# Patient Record
Sex: Female | Born: 1960 | Race: White | Hispanic: No | Marital: Married | Smoking: Current every day smoker
Health system: Southern US, Community
[De-identification: ages and names within clinical notes are randomized; demographics above are authoritative.]

## PROBLEM LIST (undated history)

## (undated) DIAGNOSIS — I10 Essential (primary) hypertension: Secondary | ICD-10-CM

## (undated) DIAGNOSIS — F419 Anxiety disorder, unspecified: Secondary | ICD-10-CM

## (undated) DIAGNOSIS — E785 Hyperlipidemia, unspecified: Secondary | ICD-10-CM

## (undated) DIAGNOSIS — M1990 Unspecified osteoarthritis, unspecified site: Secondary | ICD-10-CM

## (undated) DIAGNOSIS — L03116 Cellulitis of left lower limb: Secondary | ICD-10-CM

## (undated) HISTORY — DX: Anxiety disorder, unspecified: F41.9

## (undated) HISTORY — DX: Essential (primary) hypertension: I10

## (undated) HISTORY — DX: Unspecified osteoarthritis, unspecified site: M19.90

---

## 1984-01-18 HISTORY — PX: TUBAL LIGATION: SHX77

## 2008-01-18 HISTORY — PX: LAPAROSCOPIC CHOLECYSTECTOMY: SUR755

## 2014-10-28 ENCOUNTER — Other Ambulatory Visit: Payer: Self-pay | Admitting: Pulmonary Disease

## 2014-10-28 DIAGNOSIS — Z1231 Encounter for screening mammogram for malignant neoplasm of breast: Secondary | ICD-10-CM

## 2014-11-05 ENCOUNTER — Ambulatory Visit: Payer: Self-pay

## 2017-06-21 ENCOUNTER — Inpatient Hospital Stay (HOSPITAL_COMMUNITY)
Admission: EM | Admit: 2017-06-21 | Discharge: 2017-06-23 | DRG: 603 | Disposition: A | Discharge status: Home / Self Care | Payer: BLUE CROSS/BLUE SHIELD | Attending: Family Medicine | Admitting: Family Medicine

## 2017-06-21 ENCOUNTER — Inpatient Hospital Stay (HOSPITAL_COMMUNITY): Payer: BLUE CROSS/BLUE SHIELD

## 2017-06-21 ENCOUNTER — Other Ambulatory Visit: Payer: Self-pay

## 2017-06-21 ENCOUNTER — Encounter (HOSPITAL_COMMUNITY): Payer: Self-pay | Admitting: Emergency Medicine

## 2017-06-21 DIAGNOSIS — B9562 Methicillin resistant Staphylococcus aureus infection as the cause of diseases classified elsewhere: Secondary | ICD-10-CM | POA: Diagnosis present

## 2017-06-21 DIAGNOSIS — I10 Essential (primary) hypertension: Secondary | ICD-10-CM | POA: Diagnosis not present

## 2017-06-21 DIAGNOSIS — R402413 Glasgow coma scale score 13-15, at hospital admission: Secondary | ICD-10-CM | POA: Diagnosis present

## 2017-06-21 DIAGNOSIS — Z833 Family history of diabetes mellitus: Secondary | ICD-10-CM

## 2017-06-21 DIAGNOSIS — R739 Hyperglycemia, unspecified: Secondary | ICD-10-CM | POA: Diagnosis present

## 2017-06-21 DIAGNOSIS — E876 Hypokalemia: Secondary | ICD-10-CM | POA: Diagnosis present

## 2017-06-21 DIAGNOSIS — E785 Hyperlipidemia, unspecified: Secondary | ICD-10-CM | POA: Diagnosis not present

## 2017-06-21 DIAGNOSIS — L0390 Cellulitis, unspecified: Secondary | ICD-10-CM

## 2017-06-21 DIAGNOSIS — L03116 Cellulitis of left lower limb: Principal | ICD-10-CM | POA: Diagnosis present

## 2017-06-21 DIAGNOSIS — E782 Mixed hyperlipidemia: Secondary | ICD-10-CM

## 2017-06-21 DIAGNOSIS — M79605 Pain in left leg: Secondary | ICD-10-CM | POA: Diagnosis not present

## 2017-06-21 HISTORY — DX: Cellulitis of left lower limb: L03.116

## 2017-06-21 HISTORY — DX: Hyperlipidemia, unspecified: E78.5

## 2017-06-21 LAB — HEMOGLOBIN A1C
HEMOGLOBIN A1C: 5.4 % (ref 4.8–5.6)
MEAN PLASMA GLUCOSE: 108.28 mg/dL

## 2017-06-21 LAB — I-STAT CG4 LACTIC ACID, ED: Lactic Acid, Venous: 1.27 mmol/L (ref 0.5–1.9)

## 2017-06-21 LAB — COMPREHENSIVE METABOLIC PANEL
ALK PHOS: 89 U/L (ref 38–126)
ALT: 30 U/L (ref 14–54)
ANION GAP: 11 (ref 5–15)
AST: 24 U/L (ref 15–41)
Albumin: 3.4 g/dL — ABNORMAL LOW (ref 3.5–5.0)
BUN: 11 mg/dL (ref 6–20)
CALCIUM: 9 mg/dL (ref 8.9–10.3)
CO2: 26 mmol/L (ref 22–32)
Chloride: 103 mmol/L (ref 101–111)
Creatinine, Ser: 0.8 mg/dL (ref 0.44–1.00)
Glucose, Bld: 126 mg/dL — ABNORMAL HIGH (ref 65–99)
Potassium: 3.1 mmol/L — ABNORMAL LOW (ref 3.5–5.1)
Sodium: 140 mmol/L (ref 135–145)
TOTAL PROTEIN: 7.9 g/dL (ref 6.5–8.1)
Total Bilirubin: 0.5 mg/dL (ref 0.3–1.2)

## 2017-06-21 LAB — CBC WITH DIFFERENTIAL/PLATELET
ABS IMMATURE GRANULOCYTES: 0 10*3/uL (ref 0.0–0.1)
BASOS ABS: 0 10*3/uL (ref 0.0–0.1)
BASOS PCT: 0 %
Eosinophils Absolute: 0.3 10*3/uL (ref 0.0–0.7)
Eosinophils Relative: 4 %
HCT: 41.5 % (ref 36.0–46.0)
Hemoglobin: 13.8 g/dL (ref 12.0–15.0)
IMMATURE GRANULOCYTES: 0 %
Lymphocytes Relative: 25 %
Lymphs Abs: 1.8 10*3/uL (ref 0.7–4.0)
MCH: 29.1 pg (ref 26.0–34.0)
MCHC: 33.3 g/dL (ref 30.0–36.0)
MCV: 87.4 fL (ref 78.0–100.0)
MONO ABS: 0.6 10*3/uL (ref 0.1–1.0)
Monocytes Relative: 9 %
NEUTROS ABS: 4.2 10*3/uL (ref 1.7–7.7)
NEUTROS PCT: 62 %
PLATELETS: 275 10*3/uL (ref 150–400)
RBC: 4.75 MIL/uL (ref 3.87–5.11)
RDW: 13.4 % (ref 11.5–15.5)
WBC: 6.9 10*3/uL (ref 4.0–10.5)

## 2017-06-21 LAB — LIPID PANEL
CHOL/HDL RATIO: 5.6 ratio
Cholesterol: 211 mg/dL — ABNORMAL HIGH (ref 0–200)
HDL: 38 mg/dL — AB (ref >40)
LDL CALC: 153 mg/dL — AB (ref 0–99)
Triglycerides: 99 mg/dL (ref <150)
VLDL: 20 mg/dL (ref 0–40)

## 2017-06-21 MED ORDER — ACETAMINOPHEN 325 MG PO TABS
650.0000 mg | ORAL_TABLET | Freq: Four times a day (QID) | ORAL | Status: DC | PRN
  Administered 2017-06-21: 650 mg via ORAL
  Filled 2017-06-21: qty 2

## 2017-06-21 MED ORDER — VANCOMYCIN HCL IN DEXTROSE 1-5 GM/200ML-% IV SOLN
1000.0000 mg | Freq: Once | INTRAVENOUS | Status: AC
  Administered 2017-06-21: 1000 mg via INTRAVENOUS
  Filled 2017-06-21: qty 200

## 2017-06-21 MED ORDER — IBUPROFEN 400 MG PO TABS
400.0000 mg | ORAL_TABLET | Freq: Four times a day (QID) | ORAL | Status: DC | PRN
  Administered 2017-06-22: 400 mg via ORAL
  Filled 2017-06-21: qty 1

## 2017-06-21 MED ORDER — CEFAZOLIN SODIUM-DEXTROSE 2-4 GM/100ML-% IV SOLN
2.0000 g | Freq: Three times a day (TID) | INTRAVENOUS | Status: DC
  Filled 2017-06-21: qty 100

## 2017-06-21 MED ORDER — CEFAZOLIN SODIUM-DEXTROSE 1-4 GM/50ML-% IV SOLN
1.0000 g | Freq: Once | INTRAVENOUS | Status: AC
  Administered 2017-06-21: 1 g via INTRAVENOUS
  Filled 2017-06-21: qty 50

## 2017-06-21 MED ORDER — VANCOMYCIN HCL IN DEXTROSE 1-5 GM/200ML-% IV SOLN
1000.0000 mg | INTRAVENOUS | Status: AC
  Administered 2017-06-21: 1000 mg via INTRAVENOUS
  Filled 2017-06-21: qty 200

## 2017-06-21 MED ORDER — PIPERACILLIN-TAZOBACTAM 3.375 G IVPB
3.3750 g | Freq: Three times a day (TID) | INTRAVENOUS | Status: DC
  Administered 2017-06-21 – 2017-06-22 (×3): 3.375 g via INTRAVENOUS
  Filled 2017-06-21 (×3): qty 50

## 2017-06-21 MED ORDER — ENOXAPARIN SODIUM 40 MG/0.4ML ~~LOC~~ SOLN
40.0000 mg | SUBCUTANEOUS | Status: DC
  Administered 2017-06-21 – 2017-06-22 (×2): 40 mg via SUBCUTANEOUS
  Filled 2017-06-21 (×2): qty 0.4

## 2017-06-21 MED ORDER — VANCOMYCIN HCL IN DEXTROSE 750-5 MG/150ML-% IV SOLN
750.0000 mg | Freq: Two times a day (BID) | INTRAVENOUS | Status: DC
  Administered 2017-06-22 – 2017-06-23 (×3): 750 mg via INTRAVENOUS
  Filled 2017-06-21 (×3): qty 150

## 2017-06-21 NOTE — Progress Notes (Signed)
MD call to verify if nasal swap is needed for MRSA no orders for MRSA swap wound is being treated as MRSA infection. Will continue to monitor.Arthor Captain LPN

## 2017-06-21 NOTE — H&P (Addendum)
[facility] Admission History and Physical Service Pager: [phone]  Patient name: [name] [name] record number: [id number] Date of birth: 1960/07/10 Age: 57 y.o. Gender: female  Primary Care Provider: Patient, No Pcp Per Consultants: none Code Status: full  Chief Complaint: cellulitis  Assessment and Plan: [name] [name] is a 57 y.o. female presenting with rash. PMH is significant for remote tobacco use, hyperglycemia, and HLD.  L Leg Cellulitis, likely MRSA: Patient has highly progressing cellulitis that has failed Keflex treatment.  While her vitals, WBC, and lactic acid are reassuring and her wound is not draining purulent material, the severity of her rash and unresponsiveness to Keflex indicates that she needs broad spectrum IV antibiotics due to possible MRSA infection (or anaerobes/pseudomonas/other gram negatives not covered by Keflex).  Lack of eschar, crepitus, and pallor and presence of pulses and sensation distal to the affect area make necrotizing fasciitis less likely, but it is still on the differential.  Patient is not immunosuppressed as far as we know, although she does have a family history of T2DM and hyperglycemia on her BMP.  Will start IV antibiotics and obtain xray of leg to further investigate possibility of necrotizing fasciitis. - admit to med-surg, attending Dr. Andria Frames - Vancomycin and Zosyn, dosed by pharmacy. S/p ancef in ED - wound demarcated, will monitor borders - pain control with tylenol and ibuprofen - x-ray of L tib/fib - will consult surgery if patient develops signs concerning for compartment syndrome - contact precautions - am CBC, BMP - wound care consult - blood cultures x2 obtained  Hyperglycemia: Patient with strong family history of T2DM, obesity, and blood sugar of 126 on BMP.  T2DM would increase patient's susceptibility to pseudomonas and other bacteria.  Last A1c was two years ago and normal per patient. -  obtain Hgb A1c  HLD: Patient reports that she had high cholesterol when it was checked two years ago but does not remember the actual values of her lipid panel. - obtain lipid panel  FEN/GI: regular diet Prophylaxis: lovenox  Disposition: admit to med-surg, attending Dr. Andria Frames  History of Present Illness:  [name] [name] is a 57 y.o. female presenting with cellulitis.  She reports that a day or so before [facility], she came into contact with poison ivy.  On Friday, 5/31, she felt like she had a fever, then she noticed a rash on her anterior left shin the following morning.  She went to Urgent Care that day, who prescribed her Keflex for cellulitis.  She took this medication as prescribed, but on Sunday, she noticed that she began to develop small blisters.  These blisters increased in size, and her rash spread up her shin and to the posterior area of her L leg on Monday and Tuesday.  She took about 6 tabs of 200 mg ibuprofen each day to help ease the pain of the rash.  There has been no itchiness from the rash.  She continued to have subjective fevers, with chills and a loss of appetite over the course of this rash, but she did not take her temperature.  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  She does not remember any injury or breaks in the skin preceding the rash, but she says it's possible that she nicked the area.  She would like to go home as soon as possible because she has a husband at home with MS who she  needs to care for.  Review Of Systems: Per HPI with the following additions:   Review of Systems  Constitutional: Positive for chills, fever and weight loss. Negative for diaphoresis.  Respiratory: Negative for cough and shortness of breath.   Cardiovascular: Negative for chest pain.  Gastrointestinal: Positive for nausea. Negative for constipation, diarrhea and vomiting.  Genitourinary: Negative for dysuria,  frequency and urgency.  Musculoskeletal: Negative for myalgias.  Skin: Positive for rash. Negative for itching.  Neurological: Negative for dizziness, sensory change and headaches.  Endo/Heme/Allergies: Does not bruise/bleed easily.    Patient Active Problem List   Diagnosis Date Noted  . Cellulitis 06/21/2017  . Hyperlipidemia 06/21/2017  . Hyperglycemia 06/21/2017    Past Medical History: Past Medical History:  Diagnosis Date  . Hyperlipidemia     Past Surgical History: Past Surgical History:  Procedure Laterality Date  . CHOLECYSTECTOMY    . TUBAL LIGATION      Social History: Social History   Tobacco Use  . Smoking status: Former Smoker    Packs/day: 1.00    Years: 40.00    Pack years: 40.00    Types: Cigarettes    Last attempt to quit: 2004    Years since quitting: 15.4  . Smokeless tobacco: Never Used  Substance Use Topics  . Alcohol use: Not Currently  . Drug use: Not Currently   Additional social history: lives with husband, son, and grandson. Works as RT at [employer] Please also refer to relevant sections of EMR.  Family History: Family History  Problem Relation Age of Onset  . Diabetes Mother     Allergies and Medications: No Known Allergies No current facility-administered medications on file prior to encounter.    No current outpatient medications on file prior to encounter.    Objective: BP 134/72   Pulse 82   Temp 98.1 F (36.7 C) (Oral)   Resp 16   Ht 5\' 10"  (1.778 m)   Wt 240 lb (108.9 kg)   SpO2 96%   BMI 34.44 kg/m  Exam: Physical Exam  Constitutional: She is oriented to person, place, and time. She appears well-developed and well-nourished. No distress.  HENT:  Head: Normocephalic and atraumatic.  Eyes: Pupils are equal, round, and reactive to light. Conjunctivae and EOM are normal.  Neck: Normal range of motion. Neck supple.  Cardiovascular: Normal rate, regular rhythm, normal heart sounds and intact distal pulses.   Pulmonary/Chest: Effort normal and breath sounds normal. No respiratory distress.  Abdominal: Soft. Bowel sounds are normal. There is no tenderness.  Musculoskeletal: Normal range of motion.  Neurological: She is alert and oriented to person, place, and time.  Skin: Rash noted.  Well demarcated, erythematous rash on L leg.  Circumferential but without eschar.  Large blisters draining serous fluid on anterior and medial sides of shin.  1+ DP pulse on L, no crepitus felt.  L foot feels warm.  Refer to media tab or ED note for images.  Psychiatric: She has a normal mood and affect. Her behavior is normal.     Labs and Imaging: CBC BMET  Recent Labs  Lab 06/21/17 0929  WBC 6.9  HGB 13.8  HCT 41.5  PLT 275   Recent Labs  Lab 06/21/17 0929  NA 140  K 3.1*  CL 103  CO2 26  BUN 11  CREATININE 0.80  GLUCOSE 126*  CALCIUM 9.0     Lactic Acid, Venous    Component Value Date/Time   LATICACIDVEN 1.27 06/21/2017  8115     Kathrene Alu, MD 06/21/2017, 3:37 PM PGY-1, [facility] Intern pager: [phone], text pages welcome  FPTS Upper-Level Resident Addendum  I have independently interviewed and examined the patient. I have discussed the above with the original author and agree with their documentation. My edits for correction/addition/clarification are in blue. Please see also any attending notes.   Bufford Lope, DO PGY-2, [facility] 06/21/2017 4:22 PM

## 2017-06-21 NOTE — ED Provider Notes (Signed)
[facility] EMERGENCY DEPARTMENT Provider Note   CSN: [id number] Arrival date & time: 06/21/17  3086     History   Chief Complaint No chief complaint on file.   HPI [name] [name] is a 57 y.o. female who is previously healthy who presents with a 5-day history of rash to her left lower leg.  She reports the day before she developed a fever.  She has had increasing pain, redness, and now blistering with bruising.  She continues to have a fever.  She believes she had a fever last night which improved with ibuprofen.  She went to urgent care 5 days ago at first side of the rash who gave her Keflex.  Her symptoms have worsened and she went to urgent care today and they told her there was nothing else they could do.  She denies any chest pain, shortness of breath, abdominal pain, nausea, vomiting.  Patient reports she had poison ivy around that area about a week and a half ago that she was scratching related to the itching.  HPI  History reviewed. No pertinent past medical history.  Patient Active Problem List   Diagnosis Date Noted  . Cellulitis 06/21/2017    History reviewed. No pertinent surgical history.   OB History   None      Home Medications    Prior to Admission medications   Not on File    Family History No family history on file.  Social History Social History   Tobacco Use  . Smoking status: Not on file  Substance Use Topics  . Alcohol use: Not on file  . Drug use: Not on file     Allergies   Patient has no known allergies.   Review of Systems Review of Systems  Constitutional: Positive for fever. Negative for chills.  HENT: Negative for facial swelling and sore throat.   Respiratory: Negative for shortness of breath.   Cardiovascular: Negative for chest pain.  Gastrointestinal: Negative for abdominal pain, nausea and vomiting.  Genitourinary: Negative for dysuria.  Musculoskeletal: Negative for back pain.  Skin: Positive for  color change, rash and wound.  Neurological: Negative for headaches.  Psychiatric/Behavioral: The patient is not nervous/anxious.      Physical Exam Updated Vital Signs BP 123/65   Pulse 79   Temp 97.7 F (36.5 C) (Oral)   Resp 16   SpO2 98%   Physical Exam  Constitutional: She appears well-developed and well-nourished. No distress.  HENT:  Head: Normocephalic and atraumatic.  Mouth/Throat: Oropharynx is clear and moist. No oropharyngeal exudate.  Eyes: Pupils are equal, round, and reactive to light. Conjunctivae are normal. Right eye exhibits no discharge. Left eye exhibits no discharge. No scleral icterus.  Neck: Normal range of motion. Neck supple. No thyromegaly present.  Cardiovascular: Normal rate, regular rhythm, normal heart sounds and intact distal pulses. Exam reveals no gallop and no friction rub.  No murmur heard. Pulmonary/Chest: Effort normal and breath sounds normal. No stridor. No respiratory distress. She has no wheezes. She has no rales.  Abdominal: Soft. Bowel sounds are normal. She exhibits no distension. There is no tenderness. There is no rebound and no guarding.  Musculoskeletal: She exhibits edema (L foot).  Lymphadenopathy:    She has no cervical adenopathy.  Neurological: She is alert. Coordination normal.  Skin: Skin is warm and dry. No rash noted. She is not diaphoretic. No pallor.  Circumferential erythema to the left lower leg with 2 large fluid-filled blisters, tender and  warm, see photo  Psychiatric: She has a normal mood and affect.  Nursing note and vitals reviewed.        ED Treatments / Results  Labs (all labs ordered are listed, but only abnormal results are displayed) Labs Reviewed  COMPREHENSIVE METABOLIC PANEL - Abnormal; Notable for the following components:      Result Value   Potassium 3.1 (*)    Glucose, Bld 126 (*)    Albumin 3.4 (*)    All other components within normal limits  CULTURE, BLOOD (ROUTINE X 2)  CULTURE,  BLOOD (ROUTINE X 2)  CBC WITH DIFFERENTIAL/PLATELET  URINALYSIS, ROUTINE W REFLEX MICROSCOPIC  I-STAT CG4 LACTIC ACID, ED    EKG None  Radiology No results found.  Procedures Procedures (including critical care time)  Medications Ordered in ED Medications  vancomycin (VANCOCIN) IVPB 1000 mg/200 mL premix (1,000 mg Intravenous New Bag/Given 06/21/17 1351)  ceFAZolin (ANCEF) IVPB 1 g/50 mL premix (0 g Intravenous Stopped 06/21/17 1336)     Initial Impression / Assessment and Plan / ED Course  I have reviewed the triage vital signs and the nursing notes.  Pertinent labs & imaging results that were available during my care of the patient were reviewed by me and considered in my medical decision making (see chart for details).     Patient with significant cellulitis to left lower extremity.  She reports fevers at home.  Labs are unremarkable, however considering the appearance and no improvement with antibiotics, will admit to the family medicine teaching service for further management.  Blood cultures pending.  IV vancomycin and Ancef initiated in the ED.  I discussed patient case with Dr. Shan Levans with the family medicine teaching service who will admit the patient.  I appreciate her assistance with the patient. I discussed patient case with Dr. Alvino Chapel who guided the patient's management and agrees with plan.   Final Clinical Impressions(s) / ED Diagnoses   Final diagnoses:  Cellulitis of left lower extremity    ED Discharge Orders    None       Frederica Kuster, PA-C 06/21/17 1428    Davonna Belling, MD 06/21/17 2209

## 2017-06-21 NOTE — ED Triage Notes (Signed)
Patient complains of cellulitis on left lower leg x1 week, seen at urgent care on Friday, prescribed keflex. Cellulitis has gotten worse after abx. Alert, oriented, and in no apparent distress at this time.

## 2017-06-21 NOTE — Progress Notes (Signed)
[name] [name] [id number] Admission Data: 06/21/2017 7:27 PM Attending Provider: Zenia Resides, MD  SVX:[id number], No Pcp Per Consults/ Treatment Team:   [name] [name] is a 57 y.o. female patient admitted from ED awake, alert  & orientated  X 3,  Full Code, VSS - Blood pressure 134/72, pulse 82, temperature 98.1 F (36.7 C), temperature source Oral, resp. rate 16, height 5\' 10"  (1.778 m), weight 108.9 kg (240 lb), SpO2 96 %., , no c/o shortness of breath, no c/o chest pain, no distress noted.    IV site WDL:  antecubital right, condition patent and no redness with a transparent dsg that's clean dry and intact.  Allergies:  No Known Allergies   Past Medical History:  Diagnosis Date  . Cellulitis of left lower extremity 06/21/2017  . Hyperlipidemia 06/21/2017    History:  obtained from chart review. Tobacco/alcohol: denied none  Pt orientation to unit, room and routine. Information packet given to patient/family and safety video watched.  Admission INP armband ID verified with patient/family, and in place. SR up x 2, fall risk assessment complete with Patient and family verbalizing understanding of risks associated with falls. Pt verbalizes an understanding of how to use the call bell and to call for help before getting out of bed.  Skin, clean-dry- intact without evidence of bruising, or skin tears.   No evidence of skin break down noted on exam. no rashes, no ecchymoses, no petechiae, no nodules, no jaundice, no purpura, cellulitis to RLE with two large blisters. WOC consult and telfa wrapped with kerlix applied.     Will cont to monitor and assist as needed.  Salley Slaughter, RN 06/21/2017 7:27 PM

## 2017-06-21 NOTE — Progress Notes (Signed)
Pharmacy Antibiotic Note  [name] [name] is a 57 y.o. female admitted on 06/21/2017 with LLE cellulitis that failed outpatient cephalexin.  Pharmacy has been consulted for vancomycin and Zosyn dosing. Plan is to quickly narrow as infection comes under control. She is afebrile, WBC are normal, and renal function is normal. She received vancomycin 1000 mg IV at 13:51. Norm CrCl ~90 ml/min.  Plan: Vancomycin 1000 mg IV once (to complete a 2000 mg load) Vancomycin 750 mg IV q12h Zosyn 3.375 g IV q8h to be infused over 4 hours Monitor renal function, clinical progress, C/S, and de-escalation as able Vancomycin trough as clinically indicated   Height: 5\' 10"  (177.8 cm) Weight: 240 lb (108.9 kg) IBW/kg (Calculated) : 68.5  Temp (24hrs), Avg:97.9 F (36.6 C), Min:97.7 F (36.5 C), Max:98.1 F (36.7 C)  Recent Labs  Lab 06/21/17 0929 06/21/17 0957  WBC 6.9  --   CREATININE 0.80  --   LATICACIDVEN  --  1.27    Estimated Creatinine Clearance: 106.2 mL/min (by C-G formula based on SCr of 0.8 mg/dL).    No Known Allergies  Antimicrobials this admission: cefazolin 6/5 x1 vancomycin 6/5 >>  Zosyn 6/5 >>  Dose adjustments this admission:   Microbiology results: 6/5 BCx:    Thank you for allowing pharmacy to be a part of this patient's care.  Renold Genta, PharmD, BCPS Clinical Pharmacist Clinical phone for 06/21/2017 until 10p is [phone] 06/21/2017 3:27 PM

## 2017-06-21 NOTE — ED Notes (Signed)
Report attempted 

## 2017-06-22 DIAGNOSIS — E785 Hyperlipidemia, unspecified: Secondary | ICD-10-CM

## 2017-06-22 DIAGNOSIS — M79605 Pain in left leg: Secondary | ICD-10-CM

## 2017-06-22 LAB — BASIC METABOLIC PANEL
Anion gap: 11 (ref 5–15)
BUN: 11 mg/dL (ref 6–20)
CHLORIDE: 103 mmol/L (ref 101–111)
CO2: 25 mmol/L (ref 22–32)
Calcium: 8.7 mg/dL — ABNORMAL LOW (ref 8.9–10.3)
Creatinine, Ser: 0.73 mg/dL (ref 0.44–1.00)
GFR calc Af Amer: >60 mL/min (ref >60)
GFR calc non Af Amer: >60 mL/min (ref >60)
GLUCOSE: 144 mg/dL — AB (ref 65–99)
POTASSIUM: 2.8 mmol/L — AB (ref 3.5–5.1)
Sodium: 139 mmol/L (ref 135–145)

## 2017-06-22 LAB — MAGNESIUM: Magnesium: 1.8 mg/dL (ref 1.7–2.4)

## 2017-06-22 LAB — CBC
HEMATOCRIT: 36.4 % (ref 36.0–46.0)
Hemoglobin: 12.1 g/dL (ref 12.0–15.0)
MCH: 29.1 pg (ref 26.0–34.0)
MCHC: 33.2 g/dL (ref 30.0–36.0)
MCV: 87.5 fL (ref 78.0–100.0)
Platelets: 249 10*3/uL (ref 150–400)
RBC: 4.16 MIL/uL (ref 3.87–5.11)
RDW: 13.2 % (ref 11.5–15.5)
WBC: 5.8 10*3/uL (ref 4.0–10.5)

## 2017-06-22 LAB — PHOSPHORUS: Phosphorus: 3.7 mg/dL (ref 2.5–4.6)

## 2017-06-22 LAB — HIV ANTIBODY (ROUTINE TESTING W REFLEX): HIV Screen 4th Generation wRfx: NONREACTIVE

## 2017-06-22 MED ORDER — POTASSIUM CHLORIDE CRYS ER 20 MEQ PO TBCR: 40.0000 meq | EXTENDED_RELEASE_TABLET | Freq: Two times a day (BID) | ORAL | Status: DC

## 2017-06-22 MED ORDER — POTASSIUM CHLORIDE CRYS ER 20 MEQ PO TBCR: 40.0000 meq | EXTENDED_RELEASE_TABLET | Freq: Once | ORAL | Status: DC

## 2017-06-22 MED ORDER — POTASSIUM CHLORIDE CRYS ER 20 MEQ PO TBCR
40.0000 meq | EXTENDED_RELEASE_TABLET | ORAL | Status: AC
  Administered 2017-06-22 (×2): 40 meq via ORAL
  Filled 2017-06-22 (×2): qty 2

## 2017-06-22 NOTE — Plan of Care (Signed)
[name] [name]'s blisters have drained completely, yellow fluid.  The redness of the wound is well within the borders, new borders have been drawn.Swelling appears to be going down

## 2017-06-22 NOTE — Consult Note (Signed)
[facility] Nurse wound consult note Reason for Consult: LLE cellulitis with draining bulla No history of any similar wounds or infections previously. Has been seen in urgent care and placed on keflex without improvement. She does report working outside the week prior to the presentation of the redness and bulla and having poison ivy on her LE which she self reports to have been "scratching and scratching" Wound type: cellulitis with blistering ? Infectious process related to poison ivy Pressure Injury POA: NA Measurement: two open bulla one pretibial aprox; 11cm x 6 cm with opening centrally; one posterior calf aprx. 5cm x 5cm with bulla roof open 25% Wound GQQ:PYPP are clean, however the posterior calf wound is darker purple in the base. Both wounds have gelatinous material from build up of the bulla contents   Drainage (amount, consistency, odor) thin serous, no odor Periwound: 2+ edema of the affected LE with palpable pulses and erythema that appears to be receeding. Was marked at the time of admission Dressing procedure/placement/frequency: Single layer xeroform over blisters for antibacterial effects, monitor sites for changes in the bulla contents.  Change daily for assessment. Teaching service MD in the room with me for assessment. Patient to follow up to monitor for evolution of bulla with PCP or wound care center of her choice.  Elevate LLE when in the chair or bed.  Discussed POC with patient and bedside nurse.  Re consult if needed, will not follow at this time. Thanks  Teighan Aubert [employer], RN,CWOCN, CNS, [place] [phone])

## 2017-06-22 NOTE — Care Management Note (Signed)
Case Management Note  Patient Details  Name: [name] [name] MRN: [id number] Date of Birth: 05/14/1960  Subjective/Objective:        Pt admitted with cellulitis            Action/Plan:   PTA independent from home with husband.  Pt has active insurance however does not have PCP - CM provided both [facility] information and reference to [facility] for assistance in locating PCP.  Pt declined barriers with affording prescriptions    Expected Discharge Date:                  Expected Discharge Plan:  Home/Self Care  In-House Referral:     Discharge planning Services  CM Consult  Post Acute Care Choice:    Choice offered to:     DME Arranged:    DME Agency:     HH Arranged:    HH Agency:     Status of Service:     If discussed at [employer], dates discussed:    Additional Comments:  Maryclare Labrador, RN 06/22/2017, 10:30 AM

## 2017-06-22 NOTE — Progress Notes (Signed)
Family Medicine Teaching Service Daily Progress Note Intern Pager: [phone]  Patient name: [name] [name]: [id number] Date of birth: [birth year] Age: 57 y.o. Gender: female  Primary Care Provider: Patient, No Pcp Per Consultants: wound care Code Status: full  Pt Overview and Major Events to Date:  6/5 admitted to fpts, started on vanc/zosyn  Assessment and Plan: [name] [name] is a 57 y.o. female presenting with rash. PMH is significant for remote tobacco use, hyperglycemia, and HLD.  L Leg Cellulitis Has responded fairly well to IV vanc/zosyn. Afebrile overnight, wbc slightly lower at 5.8 from 7. The two bullae present in LLE drained of fluid, no purulent material. Margins have decreased somewhat from tracing on 6/5. Wound care evaluated and recommended xeroform gauze placed over bullous area. Appreciate their recs. Patient h/o excoriation after poison ivy exposure which is likely causal event for cellulitis. 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 Strep unlikely given failure of keflex as outpatient. LLE xray with no s/s of osteomyelitis. No organisms seen on gram stain of aerobic culture. Will stop zosyn. Will continue vancomycin for at least 24 more hours. - continue vancomycin pharmacy to dose - discontinue zosyn - tylenol and ibuprofen for pain control - follow up wound and blood cultures - wound care recommendation, appreciate their help  Hypokalemia Initial K of 3.1 at admission. Down to 2.8 this morning. Will replete with kdur [id number] bid. Unclear etiology for hypokalemia although suspect it is likely due to poor PO intake in days leading up to admission. There is no baseline to refer to so unclear if this is a chronic problem. Will check mag and phos and replete these as needed. - check mg and phos  Hyperglycemia Patient with hyperglycemia up to 144. 126 at admission. Hgb A1C. Hyperglycemia likely  2/2 increased cortisol output 2/2 cellulitis. Will continue to follow, consider starting sSSI and go to ac hs glucose checks if needed. - continue to follow sugars  Hyperlipidemia Lipid panel obtained, cholesterol, LDL slightly elevated at 211 and 153 respectively. 10 year ASCVD risk only 2.1%, but is likely underestimated given 40 pack year history. Does meet criteria for likely to benefit from statin therapy, repeat lipid panel in one year.  FEN/GI:  Regular diet PPx: lovenox  Disposition: home   Subjective:  Doing well this morning. Still with pain in LLE.   Objective: Temp:  [97.5 F (36.4 C)-98.1 F (36.7 C)] 97.5 F (36.4 C) (06/06 0618) Pulse Rate:  [78-89] 83 (06/06 0618) Resp:  [16-18] 18 (06/06 0618) BP: (108-151)/(47-96) 108/85 (06/06 0618) SpO2:  [95 %-100 %] 97 % (06/06 0618) Weight:  [240 lb (108.9 kg)] 240 lb (108.9 kg) (06/05 1500) Physical Exam: Constitutional: well appearing, obese, caucasian female resting in bed. Cardiovascular: rrr, no m/r/g. Palpable pt/dp in BLE. Pulmonary/Chest: Lungs clear to auscultation bilaterally, no increased work of breathing Abdominal: soft, non-tender, non-distended.  Musculoskeletal: Normal range of motion. 5/5 strenght all muscle groups BLE, BUE Derm: demarcated, erythematous rash on L leg. 2 large bullae within this area. Fluid easily expressed from these. Psychiatric: She has a normal mood and affect. Her behavior is normal.    Laboratory: Recent Labs  Lab 06/21/17 0929 06/22/17 0650  WBC 6.9 5.8  HGB 13.8 12.1  HCT 41.5 36.4  PLT 275 249   Recent Labs  Lab 06/21/17 0929 06/22/17 0650  NA 140 139  K 3.1* 2.8*  CL 103 103  CO2 26 25  BUN 11 11  CREATININE 0.80 0.73  CALCIUM 9.0 8.7*  PROT 7.9  --   BILITOT 0.5  --   ALKPHOS 89  --   ALT 30  --   AST 24  --   GLUCOSE 126* 144*    Imaging/Diagnostic Tests: CLINICAL DATA:  Left lower leg cellulitis for 1 week, initial encounter  EXAM: LEFT TIBIA  AND FIBULA - 2 VIEW  COMPARISON:  None.  FINDINGS: No acute fracture or dislocation is noted. Soft tissue swelling is noted consistent with the given clinical history. No other focal abnormality is noted.  IMPRESSION: Soft tissue changes consistent with history of cellulitis. No acute bony abnormality is noted.  Guadalupe Dawn, MD 06/22/2017, 8:45 AM PGY-1, [facility] Intern pager: [phone], text pages welcome

## 2017-06-23 DIAGNOSIS — E876 Hypokalemia: Secondary | ICD-10-CM

## 2017-06-23 DIAGNOSIS — I10 Essential (primary) hypertension: Secondary | ICD-10-CM

## 2017-06-23 DIAGNOSIS — M79605 Pain in left leg: Secondary | ICD-10-CM

## 2017-06-23 LAB — CBC WITH DIFFERENTIAL/PLATELET
ABS IMMATURE GRANULOCYTES: 0.1 10*3/uL (ref 0.0–0.1)
BASOS PCT: 1 %
Basophils Absolute: 0 10*3/uL (ref 0.0–0.1)
Eosinophils Absolute: 0.4 10*3/uL (ref 0.0–0.7)
Eosinophils Relative: 7 %
HCT: 39.7 % (ref 36.0–46.0)
Hemoglobin: 13 g/dL (ref 12.0–15.0)
Immature Granulocytes: 1 %
Lymphocytes Relative: 37 %
Lymphs Abs: 2.5 10*3/uL (ref 0.7–4.0)
MCH: 29.7 pg (ref 26.0–34.0)
MCHC: 32.7 g/dL (ref 30.0–36.0)
MCV: 90.8 fL (ref 78.0–100.0)
MONO ABS: 0.5 10*3/uL (ref 0.1–1.0)
MONOS PCT: 7 %
NEUTROS ABS: 3.2 10*3/uL (ref 1.7–7.7)
Neutrophils Relative %: 47 %
PLATELETS: 279 10*3/uL (ref 150–400)
RBC: 4.37 MIL/uL (ref 3.87–5.11)
RDW: 13.3 % (ref 11.5–15.5)
WBC: 6.6 10*3/uL (ref 4.0–10.5)

## 2017-06-23 LAB — BASIC METABOLIC PANEL
Anion gap: 10 (ref 5–15)
BUN: 12 mg/dL (ref 6–20)
CO2: 25 mmol/L (ref 22–32)
Calcium: 8.9 mg/dL (ref 8.9–10.3)
Chloride: 106 mmol/L (ref 101–111)
Creatinine, Ser: 0.66 mg/dL (ref 0.44–1.00)
GFR calc Af Amer: >60 mL/min (ref >60)
GFR calc non Af Amer: >60 mL/min (ref >60)
GLUCOSE: 127 mg/dL — AB (ref 65–99)
POTASSIUM: 3.5 mmol/L (ref 3.5–5.1)
Sodium: 141 mmol/L (ref 135–145)

## 2017-06-23 MED ORDER — DOXYCYCLINE HYCLATE 100 MG PO TABS: 100.0000 mg | ORAL_TABLET | Freq: Two times a day (BID) | ORAL | 0 refills | Status: AC

## 2017-06-23 MED ORDER — DOXYCYCLINE HYCLATE 100 MG PO TABS
100.0000 mg | ORAL_TABLET | Freq: Two times a day (BID) | ORAL | Status: DC
  Administered 2017-06-23: 100 mg via ORAL
  Filled 2017-06-23: qty 1

## 2017-06-23 NOTE — Discharge Instructions (Signed)
You are being discharged with an antibiotic called doxycyline. You will take this for the next 7 days. It will be 100mg  two times per day. Please keep your follow up with your wound care center on 6/26. Please make an appointment for early next week to see your new pcp. They can check on the wound there.   Cellulitis, Adult Cellulitis is a skin infection. The infected area is usually red and sore. This condition occurs most often in the arms and lower legs. It is very important to get treated for this condition. Follow these instructions at home:  Take over-the-counter and prescription medicines only as told by your doctor.  If you were prescribed an antibiotic medicine, take it as told by your doctor. Do not stop taking the antibiotic even if you start to feel better.  Drink enough fluid to keep your pee (urine) clear or pale yellow.  Do not touch or rub the infected area.  Raise (elevate) the infected area above the level of your heart while you are sitting or lying down.  Place warm or cold wet cloths (warm or cold compresses) on the infected area. Do this as told by your doctor.  Keep all follow-up visits as told by your doctor. This is important. These visits let your doctor make sure your infection is not getting worse. Contact a doctor if:  You have a fever.  Your symptoms do not get better after 1-2 days of treatment.  Your bone or joint under the infected area starts to hurt after the skin has healed.  Your infection comes back. This can happen in the same area or another area.  You have a swollen bump in the infected area.  You have new symptoms.  You feel ill and also have muscle aches and pains. Get help right away if:  Your symptoms get worse.  You feel very sleepy.  You throw up (vomit) or have watery poop (diarrhea) for a long time.  There are red streaks coming from the infected area.  Your red area gets larger.  Your red area turns darker. This  information is not intended to replace advice given to you by your health care provider. Make sure you discuss any questions you have with your health care provider. Document Released: 06/22/2007 Document Revised: 06/11/2015 Document Reviewed: 11/12/2014 Elsevier Interactive Patient Education  2018 [employer].

## 2017-06-23 NOTE — Progress Notes (Signed)
Family Medicine Teaching Service Daily Progress Note Intern Pager: [phone]  Patient name: [name] [name] record number: [id number] Date of birth: [birth year] Age: 57 y.o. Gender: female  Primary Care Provider: Patient, No Pcp Per Consultants: wound care Code Status: full  Pt Overview and Major Events to Date:  6/5 admitted to fpts, started on vanc/zosyn  Assessment and Plan: [name] [name] is a 57 y.o. female presenting with rash. PMH is significant for remote tobacco use, hyperglycemia, and HLD.  L Leg Cellulitis Continues to respond well to vancomycin. Afebrile and white count stable. Will switch to oral doxycyline and give patient one dose. If can tolerate with no difficulty will discharge to complete a 10 total day course. Patient will need to follow up with a wound care center. Per CM notes patient has follow up on 6/26 with wound care center. Will need to follow up with pcp early next week for wound check and make sure wound still improving.  - stopping vancomycin - doxycycline 100mg  bid for next 7 days - tylenol and ibuprofen for pain control - follow up wound and blood cultures - follow up with pcp early next week and wound care center on 6/26.  Hypokalemia K 2.8 on 6/6, repleted with 2 doses 38meq kdur. 3.5 this am. Mg and phos w/n/l. - consider k check as outpatient  Hyperglycemia Well controlled since admission. - continue to follow sugars  Hyperlipidemia Lipid panel obtained, cholesterol, LDL slightly elevated at 211 and 153 respectively. 10 year ASCVD risk only 2.1%, but is likely underestimated given 40 pack year history. Does meet criteria for likely to benefit from statin therapy, repeat lipid panel in one year.  FEN/GI:  Regular diet PPx: lovenox  Disposition: home   Subjective:  Pain resolving somewhat, ready to go home  Objective: Temp:  [98.1 F (36.7 C)-98.4 F (36.9 C)] 98.2 F (36.8 C) (06/07 0557) Pulse Rate:  [79-85] 82 (06/07  0557) Resp:  [18] 18 (06/07 0557) BP: (103-119)/(62-80) 103/62 (06/07 0557) SpO2:  [99 %] 99 % (06/07 0557) Physical Exam: Constitutional: well appearing, obese, caucasian female resting in bed. Cardiovascular: regular rate rhythm, no mumurs/rubs/gallops. Palpable pt/dp in BLE. Pulmonary/Chest: Lungs clear to auscultation bilaterally, no increased work of breathing Abdominal: soft, non-tender, non-distended.  Musculoskeletal: Normal range of motion. 5/5 strenght all muscle groups BLE, BUE Derm: demarcated, erythematous rash on L leg. 2 large bullae within this area. Fluid easily expressed from these. Psychiatric: She has a normal mood and affect. Her behavior is normal.    Laboratory: Recent Labs  Lab 06/21/17 0929 06/22/17 0650 06/23/17 0705  WBC 6.9 5.8 6.6  HGB 13.8 12.1 13.0  HCT 41.5 36.4 39.7  PLT 275 249 279   Recent Labs  Lab 06/21/17 0929 06/22/17 0650 06/23/17 0705  NA 140 139 141  K 3.1* 2.8* 3.5  CL 103 103 106  CO2 26 25 25   BUN 11 11 12   CREATININE 0.80 0.73 0.66  CALCIUM 9.0 8.7* 8.9  PROT 7.9  --   --   BILITOT 0.5  --   --   ALKPHOS 89  --   --   ALT 30  --   --   AST 24  --   --   GLUCOSE 126* 144* 127*    Imaging/Diagnostic Tests: CLINICAL DATA:  Left lower leg cellulitis for 1 week, initial encounter  EXAM: LEFT TIBIA AND FIBULA - 2 VIEW  COMPARISON:  None.  FINDINGS: No acute fracture or dislocation is noted.  Soft tissue swelling is noted consistent with the given clinical history. No other focal abnormality is noted.  IMPRESSION: Soft tissue changes consistent with history of cellulitis. No acute bony abnormality is noted.  Guadalupe Dawn, MD 06/23/2017, 11:30 AM PGY-1, [place] Intern pager: [phone], text pages welcome

## 2017-06-23 NOTE — Care Management Note (Signed)
Case Management Note  Patient Details  Name: Armoni Kludt MRN: [id number] Date of Birth: 06-01-1960  Subjective/Objective:                 Appointment placed on AVS for [facility] 6/26. They will call patient if anything becomes available sooner. Patient updated. No further CM needs.    Action/Plan:   Expected Discharge Date:                  Expected Discharge Plan:  Home/Self Care  In-House Referral:     Discharge planning Services  CM Consult, Follow-up appt scheduled  Post Acute Care Choice:    Choice offered to:     DME Arranged:    DME Agency:     HH Arranged:    HH Agency:     Status of Service:  Completed, signed off  If discussed at [employer] of Stay Meetings, dates discussed:    Additional Comments:  Carles Collet, RN 06/23/2017, 11:18 AM

## 2017-06-23 NOTE — Progress Notes (Signed)
[name] [name] to be D/C'd Home per MD order.  Discussed with the patient and all questions fully answered.  VSS, Skin clean, dry and intact without evidence of skin break down, no evidence of skin tears noted. IV catheter discontinued intact. Site without signs and symptoms of complications. Dressing and pressure applied.  An After Visit Summary was printed and given to the patient. Patient received prescription.  D/c education completed with patient/family including follow up instructions, medication list, d/c activities limitations if indicated, with other d/c instructions as indicated by MD - patient able to verbalize understanding, all questions fully answered.   Patient instructed to return to ED, call 911, or call MD for any changes in condition.   Patient escorted via [place], and D/C home via private auto.  Luci Bank 06/23/2017 2:14 PM

## 2017-06-24 LAB — AEROBIC CULTURE W GRAM STAIN (SUPERFICIAL SPECIMEN)
Culture: NO GROWTH
Gram Stain: NONE SEEN

## 2017-06-24 LAB — AEROBIC CULTURE  (SUPERFICIAL SPECIMEN)

## 2017-06-24 NOTE — Discharge Summary (Signed)
[facility] Discharge Summary  Patient name: [name] [name] record number: [id number] Date of birth: [birth year] Age: 57 y.o. Gender: female Date of Admission: 06/21/2017  Date of Discharge: 06/23/2017 Admitting Physician: Kathrene Alu, MD  Primary Care Provider: Patient, No Pcp Per Consultants: none  Indication for Hospitalization: left leg cellulitis with outpatient treatment failure  Discharge Diagnoses/Problem List:  Left leg cellulitis   Disposition: home  Discharge Condition: stable  Discharge Exam: Constitutional: well appearing, obese, caucasian female resting in bed. Cardiovascular:regular rate rhythm, no mumurs/rubs/gallops. Palpable pt/dp in BLE. Pulmonary/Chest:Lungs clear to auscultation bilaterally, no increased work of breathing Abdominal:soft, non-tender, non-distended.  Musculoskeletal:Normal range of motion. 5/5 strenght all muscle groups BLE, BUE Derm: demarcated, erythematous rash on L leg. 2 large bullae within this area. Fluid easily expressed from these. Psychiatric: She has anormal mood and affect. Herbehavior is normal.  Brief Hospital Course:  57 year old who was admitted on 6/5 for cellulitis that failed outpatient treatment. She was treated with 4 days of keflex but showed marked progression of the erythema and pain. Patient started on vanc/zosyn at admission. She was evaluated by wound care who recommended daily dressing changes with xeroform, and kerlix rolls. They also recommended follow up with wound care after discharge. She was transitioned to po antibiotics on 6/7. She was discharged to complete an additional 7 days of doxycycline to make a 10 day total course of antibiotics.  Patient has follow up at wound care center on 6/26. She will also need to follow up with her pcp next week for additional wound check.  Issues for Follow Up:  1. Follow up with wound care center on 6/26 2. Follow up with pcp early  next week for wound check 3. Ensure she took whole course of doxycycline  Significant Procedures:   Significant Labs and Imaging:  Recent Labs  Lab 06/21/17 0929 06/22/17 0650 06/23/17 0705  WBC 6.9 5.8 6.6  HGB 13.8 12.1 13.0  HCT 41.5 36.4 39.7  PLT 275 249 279   Recent Labs  Lab 06/21/17 0929 06/22/17 0650 06/22/17 0930 06/23/17 0705  NA 140 139  --  141  K 3.1* 2.8*  --  3.5  CL 103 103  --  106  CO2 26 25  --  25  GLUCOSE 126* 144*  --  127*  BUN 11 11  --  12  CREATININE 0.80 0.73  --  0.66  CALCIUM 9.0 8.7*  --  8.9  MG  --   --  1.8  --   PHOS  --   --  3.7  --   ALKPHOS 89  --   --   --   AST 24  --   --   --   ALT 30  --   --   --   ALBUMIN 3.4*  --   --   --     Results/Tests Pending at Time of Discharge:  Discharge Medications:  Allergies as of 06/23/2017   No Known Allergies     Medication List    STOP taking these medications   cephALEXin 500 MG capsule Commonly known as:  KEFLEX     TAKE these medications   doxycycline 100 MG tablet Commonly known as:  VIBRA-TABS Take 1 tablet (100 mg total) by mouth 2 (two) times daily for 7 days.       Discharge Instructions: Please refer to Patient Instructions section of EMR for full details.  Patient was  counseled important signs and symptoms that should prompt return to medical care, changes in medications, dietary instructions, activity restrictions, and follow up appointments.   Follow-Up Appointments: Follow-up Information    [facility] Follow up.   Contact information: Please call [phone] and request assistance with finding a PCP within the [facility] network that accepts your insurance.  ALso, you can call your insurance company and request assistance with locating a PCP that accepts your insurance.       [facility]             . Go on 07/12/2017.   Why:  at 12:30, please arrive 15 minutes early and bring insurance card. Contact information: [address] [phone]          Guadalupe Dawn, MD 06/24/2017, 1:29 PM PGY-1, [place]

## 2017-06-26 LAB — CULTURE, BLOOD (ROUTINE X 2)
Culture: NO GROWTH
Culture: NO GROWTH
Special Requests: ADEQUATE
Special Requests: ADEQUATE

## 2017-06-27 LAB — AEROBIC/ANAEROBIC CULTURE W GRAM STAIN (SURGICAL/DEEP WOUND)
Culture: NO GROWTH
Gram Stain: NONE SEEN

## 2017-06-27 LAB — AEROBIC/ANAEROBIC CULTURE (SURGICAL/DEEP WOUND): SPECIAL REQUESTS: NORMAL

## 2017-07-12 ENCOUNTER — Encounter (HOSPITAL_BASED_OUTPATIENT_CLINIC_OR_DEPARTMENT_OTHER): Payer: BLUE CROSS/BLUE SHIELD | Attending: Internal Medicine

## 2019-03-28 ENCOUNTER — Other Ambulatory Visit: Payer: Self-pay | Admitting: Internal Medicine

## 2019-03-28 DIAGNOSIS — Z1231 Encounter for screening mammogram for malignant neoplasm of breast: Secondary | ICD-10-CM

## 2020-05-22 ENCOUNTER — Ambulatory Visit (AMBULATORY_SURGERY_CENTER): Payer: Self-pay

## 2020-05-22 ENCOUNTER — Other Ambulatory Visit: Payer: Self-pay

## 2020-05-22 VITALS — Ht 69.0 in | Wt 241.0 lb

## 2020-05-22 DIAGNOSIS — Z800 Family history of malignant neoplasm of digestive organs: Secondary | ICD-10-CM

## 2020-05-22 DIAGNOSIS — Z1211 Encounter for screening for malignant neoplasm of colon: Secondary | ICD-10-CM

## 2020-05-22 MED ORDER — GOLYTELY 236 G PO SOLR: 4000.0000 mL | ORAL | 0 refills | Status: DC

## 2020-05-22 NOTE — Progress Notes (Signed)
No egg or soy allergy known to patient  No issues with past sedation with any surgeries or procedures Patient denies ever being told they had issues or difficulty with intubation  No FH of Malignant Hyperthermia No diet pills per patient No home 02 use per patient  No blood thinners per patient  Pt denies issues with constipation  No A fib or A flutter  EMMI video via MyChart  COVID 19 guidelines implemented in PV today with Pt and RN  Pt is fully vaccinated for Covid x 2 + booster; NO PA's for preps discussed with pt in PV today  Discussed with pt there will be an out-of-pocket cost for prep and that varies from $0 to 70 dollars  Due to the COVID-19 pandemic we are asking patients to follow certain guidelines.  Pt aware of COVID protocols and [facility] guidelines

## 2020-06-02 ENCOUNTER — Encounter: Payer: Self-pay | Admitting: Gastroenterology

## 2020-06-05 ENCOUNTER — Ambulatory Visit (AMBULATORY_SURGERY_CENTER): Payer: Commercial Managed Care - PPO | Admitting: Gastroenterology

## 2020-06-05 ENCOUNTER — Other Ambulatory Visit: Payer: Self-pay

## 2020-06-05 ENCOUNTER — Encounter: Payer: Self-pay | Admitting: Gastroenterology

## 2020-06-05 VITALS — BP 124/60 | HR 77 | Temp 98.4°F | Resp 23 | Ht 69.0 in | Wt 241.0 lb

## 2020-06-05 DIAGNOSIS — D122 Benign neoplasm of ascending colon: Secondary | ICD-10-CM

## 2020-06-05 DIAGNOSIS — Z1211 Encounter for screening for malignant neoplasm of colon: Secondary | ICD-10-CM

## 2020-06-05 DIAGNOSIS — K635 Polyp of colon: Secondary | ICD-10-CM | POA: Diagnosis not present

## 2020-06-05 DIAGNOSIS — Z800 Family history of malignant neoplasm of digestive organs: Secondary | ICD-10-CM

## 2020-06-05 MED ORDER — SODIUM CHLORIDE 0.9 % IV SOLN: 500.0000 mL | Freq: Once | INTRAVENOUS | Status: DC

## 2020-06-05 NOTE — Patient Instructions (Signed)
Information on polyps given to you today.  Await pathology results.  Resume previous diet and medications.  YOU HAD AN ENDOSCOPIC PROCEDURE TODAY AT THE [facility]:   Refer to the procedure report that was given to you for any specific questions about what was found during the examination.  If the procedure report does not answer your questions, please call your gastroenterologist to clarify.  If you requested that your care partner not be given the details of your procedure findings, then the procedure report has been included in a sealed envelope for you to review at your convenience later.  YOU SHOULD EXPECT: Some feelings of bloating in the abdomen. Passage of more gas than usual.  Walking can help get rid of the air that was put into your GI tract during the procedure and reduce the bloating. If you had a lower endoscopy (such as a colonoscopy or flexible sigmoidoscopy) you may notice spotting of blood in your stool or on the toilet paper. If you underwent a bowel prep for your procedure, you may not have a normal bowel movement for a few days.  Please Note:  You might notice some irritation and congestion in your nose or some drainage.  This is from the oxygen used during your procedure.  There is no need for concern and it should clear up in a day or so.  SYMPTOMS TO REPORT IMMEDIATELY:   Following lower endoscopy (colonoscopy or flexible sigmoidoscopy):  Excessive amounts of blood in the stool  Significant tenderness or worsening of abdominal pains  Swelling of the abdomen that is new, acute  Fever of 100F or higher   For urgent or emergent issues, a gastroenterologist can be reached at any hour by calling [phone]. Do not use MyChart messaging for urgent concerns.    DIET:  We do recommend a small meal at first, but then you may proceed to your regular diet.  Drink plenty of fluids but you should avoid alcoholic beverages for 24 hours.  ACTIVITY:  You should  plan to take it easy for the rest of today and you should NOT DRIVE or use heavy machinery until tomorrow (because of the sedation medicines used during the test).    FOLLOW UP: Our staff will call the number listed on your records 48-72 hours following your procedure to check on you and address any questions or concerns that you may have regarding the information given to you following your procedure. If we do not reach you, we will leave a message.  We will attempt to reach you two times.  During this call, we will ask if you have developed any symptoms of COVID 19. If you develop any symptoms (ie: fever, flu-like symptoms, shortness of breath, cough etc.) before then, please call [phone].  If you test positive for Covid 19 in the 2 weeks post procedure, please call and report this information to us.    If any biopsies were taken you will be contacted by phone or by letter within the next 1-3 weeks.  Please call us at [phone] if you have not heard about the biopsies in 3 weeks.    SIGNATURES/CONFIDENTIALITY: You and/or your care partner have signed paperwork which will be entered into your electronic medical record.  These signatures attest to the fact that that the information above on your After Visit Summary has been reviewed and is understood.  Full responsibility of the confidentiality of this discharge information lies with you and/or your care-partner. 

## 2020-06-05 NOTE — Progress Notes (Signed)
Called to room to assist during endoscopic procedure.  Patient ID and intended procedure confirmed with present staff. Received instructions for my participation in the procedure from the performing physician.  

## 2020-06-05 NOTE — Progress Notes (Signed)
A/ox3, pleased with MAC, report to RN 

## 2020-06-05 NOTE — Progress Notes (Signed)
Pt's states no medical or surgical changes since previsit or office visit. 

## 2020-06-05 NOTE — Op Note (Signed)
[facility] Patient Name: [name] [name] Procedure Date: 06/05/2020 10:50 AM MRN: [id number] Endoscopist: Mallie Mussel L. Loletha Carrow , MD Age: 60 Referring MD:  Date of Birth: 1960/05/31 Gender: Female Account #: [id number] Procedure:                Colonoscopy Indications:              Screening in patient at increased risk: Colorectal                            cancer in father 10 or older, This is the patient's                            first colonoscopy Medicines:                Monitored Anesthesia Care Procedure:                Pre-Anesthesia Assessment:                           - Prior to the procedure, a History and Physical                            was performed, and patient medications and                            allergies were reviewed. The patient's tolerance of                            previous anesthesia was also reviewed. The risks                            and benefits of the procedure and the sedation                            options and risks were discussed with the patient.                            All questions were answered, and informed consent                            was obtained. Prior Anticoagulants: The patient has                            taken no previous anticoagulant or antiplatelet                            agents. ASA Grade Assessment: II - A patient with                            mild systemic disease. After reviewing the risks                            and benefits, the patient was deemed in  satisfactory condition to undergo the procedure.                           After obtaining informed consent, the colonoscope                            was passed under direct vision. Throughout the                            procedure, the patient's blood pressure, pulse, and                            oxygen saturations were monitored continuously. The                            Olympus CF-[id number])  Colonoscope was                            introduced through the anus and advanced to the the                            cecum, identified by appendiceal orifice and                            ileocecal valve. The colonoscopy was performed with                            difficulty due to a redundant colon and significant                            looping. The patient tolerated the procedure well.                            The quality of the bowel preparation was good. The                            ileocecal valve, appendiceal orifice, and rectum                            were photographed. The bowel preparation used was                            GoLYTELY. Scope In: 11:17:39 AM Scope Out: 11:35:52 AM Scope Withdrawal Time: 0 hours 12 minutes 19 seconds  Total Procedure Duration: 0 hours 18 minutes 13 seconds  Findings:                 The perianal and digital rectal examinations were                            normal.                           A 10 mm polyp was found in the proximal ascending  colon. The polyp was semi-sessile. The polyp was                            removed with a cold snare. Resection and retrieval                            were complete.                           The exam was otherwise without abnormality on                            direct and retroflexion views. Complications:            No immediate complications. Estimated Blood Loss:     Estimated blood loss was minimal. Impression:               - One 10 mm polyp in the proximal ascending colon,                            removed with a cold snare. Resected and retrieved.                           - The examination was otherwise normal on direct                            and retroflexion views. Recommendation:           - Patient has a contact number available for                            emergencies. The signs and symptoms of potential                            delayed  complications were discussed with the                            patient. Return to normal activities tomorrow.                            Written discharge instructions were provided to the                            patient.                           - Resume previous diet.                           - Continue present medications.                           - Await pathology results.                           - Repeat colonoscopy is recommended for  surveillance. The colonoscopy date will be                            determined after pathology results from today's                            exam become available for review. Elonna Mcfarlane L. Myrtie Neither, MD 06/05/2020 11:39:48 AM This report has been signed electronically.

## 2020-06-09 ENCOUNTER — Telehealth: Payer: Self-pay

## 2020-06-09 ENCOUNTER — Telehealth: Payer: Self-pay | Admitting: *Deleted

## 2020-06-09 NOTE — Telephone Encounter (Signed)
  Follow up Call-  Call back number 06/05/2020  Post procedure Call Back phone  # [phone]  Permission to leave phone message Yes  Some recent data might be hidden     1st follow up call made.  NALM

## 2020-06-09 NOTE — Telephone Encounter (Signed)
Attempted 2nd phone call. No answer. Left message.

## 2020-06-14 ENCOUNTER — Encounter: Payer: Self-pay | Admitting: Gastroenterology

## 2021-05-05 ENCOUNTER — Other Ambulatory Visit: Payer: Self-pay | Admitting: Internal Medicine

## 2021-05-05 DIAGNOSIS — G959 Disease of spinal cord, unspecified: Secondary | ICD-10-CM

## 2021-05-05 DIAGNOSIS — M545 Low back pain, unspecified: Secondary | ICD-10-CM

## 2021-05-05 DIAGNOSIS — M542 Cervicalgia: Secondary | ICD-10-CM

## 2021-05-06 ENCOUNTER — Other Ambulatory Visit: Payer: Self-pay | Admitting: Internal Medicine

## 2021-05-06 DIAGNOSIS — Z1231 Encounter for screening mammogram for malignant neoplasm of breast: Secondary | ICD-10-CM

## 2021-05-12 ENCOUNTER — Other Ambulatory Visit: Payer: Self-pay | Admitting: Internal Medicine

## 2021-05-12 DIAGNOSIS — Z1231 Encounter for screening mammogram for malignant neoplasm of breast: Secondary | ICD-10-CM

## 2021-05-22 ENCOUNTER — Ambulatory Visit
Admission: RE | Admit: 2021-05-22 | Discharge: 2021-05-22 | Disposition: A | Discharge status: Home / Self Care | Payer: Commercial Managed Care - PPO | Source: Ambulatory Visit | Attending: Internal Medicine | Admitting: Internal Medicine

## 2021-05-22 DIAGNOSIS — G959 Disease of spinal cord, unspecified: Secondary | ICD-10-CM

## 2021-05-22 DIAGNOSIS — M542 Cervicalgia: Secondary | ICD-10-CM

## 2021-05-22 DIAGNOSIS — M545 Low back pain, unspecified: Secondary | ICD-10-CM

## 2021-09-17 ENCOUNTER — Ambulatory Visit
Admission: EM | Admit: 2021-09-17 | Discharge: 2021-09-17 | Disposition: A | Discharge status: Home / Self Care | Payer: Commercial Managed Care - PPO | Attending: Physician Assistant | Admitting: Physician Assistant

## 2021-09-17 DIAGNOSIS — L03116 Cellulitis of left lower limb: Secondary | ICD-10-CM | POA: Diagnosis not present

## 2021-09-17 MED ORDER — DOXYCYCLINE HYCLATE 100 MG PO CAPS: 100.0000 mg | ORAL_CAPSULE | Freq: Two times a day (BID) | ORAL | 0 refills | Status: DC

## 2021-09-17 NOTE — ED Triage Notes (Signed)
Pt states open wound to LLE for the past week. States today it started draining clear fluid.

## 2021-09-17 NOTE — ED Provider Notes (Signed)
[facility]    CSN: [id number] Arrival date & time: 09/17/21  1338      History   Chief Complaint Chief Complaint  Patient presents with   Wound Check    HPI [name] [name] is a 61 y.o. female.   Patient here today for evaluation of a wound to her left lower extremity that she noticed about a week ago.  She reports that initially there was only a small area but this is now spread to a larger area with mild erythema.  She does have history of cellulitis that required admission in 2019 and initial presentation was similar to this.  She ultimately required antibiotic therapy at that time and was suspected to have MRSA outbreak.  She has not had any fever.  She has tried keeping the area bandaged.  She has had some drainage of clear fluid from the wound.  The history is provided by the patient.    Past Medical History:  Diagnosis Date   Anxiety    on meds   Cellulitis of left lower extremity 06/21/2017   Hyperlipidemia 06/21/2017   on meds   Hypertension    on meds   Osteoarthritis     Patient Active Problem List   Diagnosis Date Noted   Hypokalemia    Pain of left lower extremity    Cellulitis 06/21/2017   Hyperlipidemia 06/21/2017   Hyperglycemia 06/21/2017    Past Surgical History:  Procedure Laterality Date   LAPAROSCOPIC CHOLECYSTECTOMY  2010   TUBAL LIGATION  1986    OB History   No obstetric history on file.      Home Medications    Prior to Admission medications   Medication Sig Start Date End Date Taking? Authorizing Provider  doxycycline (VIBRAMYCIN) 100 MG capsule Take 1 capsule (100 mg total) by mouth 2 (two) times daily. 09/17/21  Yes Francene Finders, PA-C  Ascorbic Acid (VITAMIN C PO) Take 1 tablet by mouth daily at 6 (six) AM.    [provider]  atenolol (TENORMIN) 25 MG tablet Take 25 mg by mouth daily. 04/04/20   [provider]  busPIRone (BUSPAR) 30 MG tablet Take 30 mg by mouth 2 (two) times daily. 05/04/20    [provider]  escitalopram (LEXAPRO) 10 MG tablet Take 1 tablet by mouth daily. 04/20/20   [provider]  furosemide (LASIX) 20 MG tablet Take 20 mg by mouth daily. 04/20/20   [provider]  Multiple Vitamins-Minerals (ZINC PO) Take 1 tablet by mouth daily at 6 (six) AM.    [provider]  pravastatin (PRAVACHOL) 40 MG tablet Take 40 mg by mouth at bedtime. 03/19/20   [provider]  VITAMIN D PO Take 1 tablet by mouth daily at 6 (six) AM.    [provider]    Family History Family History  Problem Relation Age of Onset   Diabetes Mother    Liver disease Mother 70       NAFLD-   Colon polyps Father 23   Colon cancer Father 53   Colon polyps Brother 23   Esophageal cancer Neg Hx    Rectal cancer Neg Hx    Stomach cancer Neg Hx     Social History Social History   Tobacco Use   Smoking status: Every Day    Packs/day: 1.00    Years: 40.00    Total pack years: 40.00    Types: Cigarettes   Smokeless tobacco: Never  Vaping  Use   Vaping Use: Never used  Substance Use Topics   Alcohol use: Not Currently   Drug use: Never     Allergies   Patient has no known allergies.   Review of Systems Review of Systems  Constitutional:  Negative for chills and fever.  Eyes:  Negative for discharge and redness.  Gastrointestinal:  Negative for abdominal pain, nausea and vomiting.  Skin:  Positive for color change and wound.     Physical Exam Triage Vital Signs ED Triage Vitals  Enc Vitals Group     BP      Pulse      Resp      Temp      Temp src      SpO2      Weight      Height      Head Circumference      Peak Flow      Pain Score      Pain Loc      Pain Edu?      Excl. in [place]?    No data found.  Updated Vital Signs BP 136/78 (BP Location: Left Arm)   Pulse 73   Temp 97.6 F (36.4 C) (Oral)   Resp 14   SpO2 96%      Physical Exam Vitals and nursing note reviewed.  Constitutional:      General: She  is not in acute distress.    Appearance: Normal appearance. She is not ill-appearing.  HENT:     Head: Normocephalic and atraumatic.  Eyes:     Conjunctiva/sclera: Conjunctivae normal.  Cardiovascular:     Rate and Rhythm: Normal rate.  Pulmonary:     Effort: Pulmonary effort is normal.  Skin:    Comments: Several mildly erythematous vesicular appearing lesions to distal left lower leg.  Edema noted to bilateral legs slightly worse to left leg.  Neurological:     Mental Status: She is alert.  Psychiatric:        Mood and Affect: Mood normal.        Behavior: Behavior normal.        Thought Content: Thought content normal.      UC Treatments / Results  Labs (all labs ordered are listed, but only abnormal results are displayed) Labs Reviewed - No data to display  EKG   Radiology No results found.  Procedures Procedures (including critical care time)  Medications Ordered in UC Medications - No data to display  Initial Impression / Assessment and Plan / UC Course  I have reviewed the triage vital signs and the nursing notes.  Pertinent labs & imaging results that were available during my care of the patient were reviewed by me and considered in my medical decision making (see chart for details).    Advised dressing changes daily and will treat to cover cellulitis with doxycycline with very strict instruction to report to ED with any worsening given her history. Patient expresses understanding.   Final Clinical Impressions(s) / UC Diagnoses   Final diagnoses:  Cellulitis of leg, left   Discharge Instructions   None    ED Prescriptions     Medication Sig Dispense Auth. Provider   doxycycline (VIBRAMYCIN) 100 MG capsule Take 1 capsule (100 mg total) by mouth 2 (two) times daily. 20 capsule Francene Finders, PA-C      PDMP not reviewed this encounter.   Francene Finders, PA-C 09/17/21 1757

## 2021-12-07 ENCOUNTER — Other Ambulatory Visit: Payer: Self-pay

## 2021-12-07 MED ORDER — ESCITALOPRAM OXALATE 10 MG PO TABS: 10.0000 mg | ORAL_TABLET | Freq: Every day | ORAL | 1 refills | Status: DC

## 2021-12-07 MED ORDER — BUSPIRONE HCL 30 MG PO TABS: 30.0000 mg | ORAL_TABLET | Freq: Two times a day (BID) | ORAL | 1 refills | Status: DC

## 2022-01-04 ENCOUNTER — Other Ambulatory Visit: Payer: Self-pay | Admitting: Internal Medicine

## 2022-01-04 DIAGNOSIS — R2242 Localized swelling, mass and lump, left lower limb: Secondary | ICD-10-CM

## 2022-01-06 ENCOUNTER — Ambulatory Visit
Admission: RE | Admit: 2022-01-06 | Discharge: 2022-01-06 | Disposition: A | Discharge status: Home / Self Care | Payer: Commercial Managed Care - PPO | Source: Ambulatory Visit | Attending: Internal Medicine | Admitting: Internal Medicine

## 2022-01-06 DIAGNOSIS — R2242 Localized swelling, mass and lump, left lower limb: Secondary | ICD-10-CM

## 2022-01-07 ENCOUNTER — Other Ambulatory Visit: Payer: Self-pay | Admitting: Internal Medicine

## 2022-01-07 MED ORDER — ALPRAZOLAM 0.5 MG PO TABS: 0.5000 mg | ORAL_TABLET | Freq: Every day | ORAL | 2 refills | Status: DC | PRN

## 2022-02-16 ENCOUNTER — Ambulatory Visit: Payer: Commercial Managed Care - PPO | Admitting: Internal Medicine

## 2022-02-16 ENCOUNTER — Encounter: Payer: Self-pay | Admitting: Internal Medicine

## 2022-02-16 VITALS — BP 142/88 | HR 82 | Temp 97.6°F | Resp 18 | Ht 69.0 in | Wt 270.6 lb

## 2022-02-16 DIAGNOSIS — I872 Venous insufficiency (chronic) (peripheral): Secondary | ICD-10-CM | POA: Diagnosis not present

## 2022-02-16 DIAGNOSIS — R252 Cramp and spasm: Secondary | ICD-10-CM | POA: Insufficient documentation

## 2022-02-16 DIAGNOSIS — G2581 Restless legs syndrome: Secondary | ICD-10-CM | POA: Diagnosis not present

## 2022-02-16 MED ORDER — ROPINIROLE HCL 0.25 MG PO TABS: 0.5000 mg | ORAL_TABLET | Freq: Every evening | ORAL | 2 refills | Status: DC

## 2022-02-16 NOTE — Assessment & Plan Note (Signed)
On exam, her lungs are clear.  She has chronic venous insufficiency.  I want her to incrase her lasix from '20mg'$  to '40mg'$  for the next 3-4 days.  I want her to elevate her feet when sitting and avoid sodium.  She is to start on compression as well and keep them on during day and off at night.  I do not think she has heart failure.

## 2022-02-16 NOTE — Progress Notes (Signed)
Office Visit  Subjective   Patient ID: [name] [name]   DOB: 08/03/1960   Age: 62 y.o.   MRN: [id number]   Chief Complaint Chief Complaint  Patient presents with   Leg Swelling    Left leg; worsened two weeks ago     History of Present Illness The patient is a 62 year old female who comes in today with swelling of both bilateral lower extremities about 2 weeks ago.  She had prior swelling last month that resolved but she has noted that her left leg is always worse than her right leg.  We did obtain a venous doppler US of her left leg on 03/09/2021 and this was negative for DVT.  Her swelling has come back and she has also noted increased varicose veins.  She states when she wakes in the morning she does not have much edema but as she stays on her feet during the day, she does get swelling.  She has noted that she now has weeping from her left leg but there are no ulcers or lesions.     Past Medical History Past Medical History:  Diagnosis Date   Anxiety    on meds   Cellulitis of left lower extremity 06/21/2017   Hyperlipidemia 06/21/2017   on meds   Hypertension    on meds   Osteoarthritis      Allergies No Known Allergies   Medications  Current Outpatient Medications:    ALPRAZolam (XANAX) 0.5 MG tablet, Take 1 tablet (0.5 mg total) by mouth daily as needed for anxiety., Disp: 30 tablet, Rfl: 2   Ascorbic Acid (VITAMIN C PO), Take 1 tablet by mouth daily at 6 (six) AM., Disp: , Rfl:    atenolol (TENORMIN) 25 MG tablet, Take 25 mg by mouth daily., Disp: , Rfl:    atorvastatin (LIPITOR) 80 MG tablet, Take 80 mg by mouth at bedtime., Disp: , Rfl:    busPIRone (BUSPAR) 30 MG tablet, Take 1 tablet (30 mg total) by mouth 2 (two) times daily., Disp: 180 tablet, Rfl: 1   celecoxib (CELEBREX) 200 MG capsule, Take 200 mg by mouth daily., Disp: , Rfl:    doxycycline (VIBRAMYCIN) 100 MG capsule, Take 1 capsule (100 mg total) by mouth 2 (two) times daily., Disp: 20 capsule, Rfl: 0    escitalopram (LEXAPRO) 10 MG tablet, Take 1 tablet (10 mg total) by mouth daily., Disp: 90 tablet, Rfl: 1   furosemide (LASIX) 20 MG tablet, Take 20 mg by mouth daily., Disp: , Rfl:    Multiple Vitamins-Minerals (ZINC PO), Take 1 tablet by mouth daily at 6 (six) AM., Disp: , Rfl:    VITAMIN D PO, Take 1 tablet by mouth daily at 6 (six) AM., Disp: , Rfl:    Review of Systems Review of Systems  Constitutional:  Negative for chills and fever.  Respiratory:  Negative for cough, shortness of breath and wheezing.   Cardiovascular:  Positive for leg swelling. Negative for chest pain and palpitations.  Gastrointestinal:  Negative for abdominal pain, constipation, diarrhea, nausea and vomiting.  Musculoskeletal:  Negative for myalgias.  Neurological:  Negative for dizziness, weakness and headaches.       Objective:    Vitals BP (!) 142/88 (BP Location: Right Arm, Patient Position: Sitting, Cuff Size: Large)   Pulse 82   Temp 97.6 F (36.4 C) (Temporal)   Resp 18   Ht '5\' 9"'$  (1.753 m)   Wt 270 lb 9.6 oz (122.7 kg)  SpO2 98%   BMI 39.96 kg/m    Physical Examination Physical Exam Constitutional:      Appearance: Normal appearance. She is not ill-appearing.  Cardiovascular:     Rate and Rhythm: Normal rate and regular rhythm.     Pulses: Normal pulses.     Heart sounds: No murmur heard.    No friction rub. No gallop.  Pulmonary:     Effort: Pulmonary effort is normal. No respiratory distress.     Breath sounds: No wheezing, rhonchi or rales.  Abdominal:     General: Bowel sounds are normal. There is no distension.     Palpations: Abdomen is soft.     Tenderness: There is no abdominal tenderness.  Musculoskeletal:     Right lower leg: Edema present.     Left lower leg: Edema present.  Skin:    General: Skin is warm and dry.     Findings: No rash.  Neurological:     Mental Status: She is alert.        Assessment & Plan:   Chronic venous insufficiency On exam, her lungs  are clear.  She has chronic venous insufficiency.  I want her to incrase her lasix from '20mg'$  to '40mg'$  for the next 3-4 days.  I want her to elevate her feet when sitting and avoid sodium.  She is to start on compression as well and keep them on during day and off at night.  I do not think she has heart failure.  Leg cramping She has cramping at night that is keeping her up involving her legs.  We will obtain a CBC, CMP and Mg2+ level at this time.  I am going to start her on requip trial to see if this helps.  Restless leg syndrome Plan as above.    Return in about 3 months (around 05/17/2022).   Townsend Roger, MD

## 2022-02-16 NOTE — Assessment & Plan Note (Signed)
Plan as above.  

## 2022-02-16 NOTE — Assessment & Plan Note (Signed)
She has cramping at night that is keeping her up involving her legs.  We will obtain a CBC, CMP and Mg2+ level at this time.  I am going to start her on requip trial to see if this helps.

## 2022-02-17 LAB — CMP14 + ANION GAP
ALT: 39 IU/L — ABNORMAL HIGH (ref 0–32)
AST: 19 IU/L (ref 0–40)
Albumin/Globulin Ratio: 2.2 (ref 1.2–2.2)
Albumin: 4.7 g/dL (ref 3.8–4.9)
Alkaline Phosphatase: 87 IU/L (ref 44–121)
Anion Gap: 18 mmol/L (ref 10.0–18.0)
BUN/Creatinine Ratio: 29 — ABNORMAL HIGH (ref 12–28)
BUN: 20 mg/dL (ref 8–27)
Bilirubin Total: 0.3 mg/dL (ref 0.0–1.2)
CO2: 26 mmol/L (ref 20–29)
Calcium: 9.6 mg/dL (ref 8.7–10.3)
Chloride: 101 mmol/L (ref 96–106)
Creatinine, Ser: 0.7 mg/dL (ref 0.57–1.00)
Globulin, Total: 2.1 g/dL (ref 1.5–4.5)
Glucose: 83 mg/dL (ref 70–99)
Potassium: 4.2 mmol/L (ref 3.5–5.2)
Sodium: 145 mmol/L — ABNORMAL HIGH (ref 134–144)
Total Protein: 6.8 g/dL (ref 6.0–8.5)
eGFR: 99 mL/min/{1.73_m2} (ref >59)

## 2022-02-17 LAB — CBC WITH DIFFERENTIAL/PLATELET
Basophils Absolute: 0 10*3/uL (ref 0.0–0.2)
Basos: 0 %
EOS (ABSOLUTE): 0 10*3/uL (ref 0.0–0.4)
Eos: 0 %
Hematocrit: 40.2 % (ref 34.0–46.6)
Hemoglobin: 13.5 g/dL (ref 11.1–15.9)
Immature Grans (Abs): 0.1 10*3/uL (ref 0.0–0.1)
Immature Granulocytes: 1 %
Lymphocytes Absolute: 2.7 10*3/uL (ref 0.7–3.1)
Lymphs: 22 %
MCH: 32.1 pg (ref 26.6–33.0)
MCHC: 33.6 g/dL (ref 31.5–35.7)
MCV: 96 fL (ref 79–97)
Monocytes Absolute: 1 10*3/uL — ABNORMAL HIGH (ref 0.1–0.9)
Monocytes: 8 %
Neutrophils Absolute: 8.6 10*3/uL — ABNORMAL HIGH (ref 1.4–7.0)
Neutrophils: 69 %
Platelets: 237 10*3/uL (ref 150–450)
RBC: 4.21 x10E6/uL (ref 3.77–5.28)
RDW: 13.7 % (ref 11.7–15.4)
WBC: 12.5 10*3/uL — ABNORMAL HIGH (ref 3.4–10.8)

## 2022-02-17 LAB — MAGNESIUM: Magnesium: 2 mg/dL (ref 1.6–2.3)

## 2022-02-19 ENCOUNTER — Other Ambulatory Visit: Payer: Self-pay | Admitting: Internal Medicine

## 2022-02-19 MED ORDER — SULFAMETHOXAZOLE-TRIMETHOPRIM 800-160 MG PO TABS: 1.0000 | ORAL_TABLET | Freq: Two times a day (BID) | ORAL | 0 refills | Status: AC

## 2022-02-21 ENCOUNTER — Other Ambulatory Visit: Payer: Self-pay | Admitting: Internal Medicine

## 2022-02-21 DIAGNOSIS — R252 Cramp and spasm: Secondary | ICD-10-CM

## 2022-02-22 ENCOUNTER — Encounter: Payer: Self-pay | Admitting: Internal Medicine

## 2022-02-22 ENCOUNTER — Ambulatory Visit: Payer: Commercial Managed Care - PPO | Admitting: Internal Medicine

## 2022-02-22 VITALS — BP 130/80 | HR 84 | Temp 98.0°F | Resp 16 | Ht 69.0 in | Wt 265.0 lb

## 2022-02-22 DIAGNOSIS — L97309 Non-pressure chronic ulcer of unspecified ankle with unspecified severity: Secondary | ICD-10-CM | POA: Insufficient documentation

## 2022-02-22 NOTE — Assessment & Plan Note (Signed)
She needs a possible [employer] for her legs.  I am going to refer her over to the wound care clinic at this time.

## 2022-02-22 NOTE — Progress Notes (Unsigned)
Office Visit  Subjective   Patient ID: [name] [name]   DOB: 05-01-1960   Age: 62 y.o.   MRN: [id number]   Chief Complaint Chief Complaint  Patient presents with   Acute Visit    Needs referral to wound care     History of Present Illness [name] [name] returns today where she was seen a week ago where she was having chronic venous insufficiency with worsening lower extremity edema.  Right after I saw her last week, she noted that her skin started having ulceration on her left ankle/lower leg and some on the posterior part of her right leg.  I did write her for lasix '40mg'$  x 4 days which she states did help with the swelling.  She has been trying to use compression hose but I'm not sure how compliant she has been with this.  She had prior swelling last month that resolved but she has noted that her left leg is always worse than her right leg.  We did obtain a venous doppler US of her left leg on 03/09/2021 and this was negative for DVT.  Her swelling has come back and she has also noted increased varicose veins.  She states when she wakes in the morning she does not have much edema but as she stays on her feet during the day, she does get swelling.  She has noted that she now has weeping from her left leg but there are no ulcers or lesions.     Past Medical History Past Medical History:  Diagnosis Date   Anxiety    on meds   Cellulitis of left lower extremity 06/21/2017   Hyperlipidemia 06/21/2017   on meds   Hypertension    on meds   Osteoarthritis      Allergies No Known Allergies   Medications  Current Outpatient Medications:    ALPRAZolam (XANAX) 0.5 MG tablet, Take 1 tablet (0.5 mg total) by mouth daily as needed for anxiety., Disp: 30 tablet, Rfl: 2   Ascorbic Acid (VITAMIN C PO), Take 1 tablet by mouth daily at 6 (six) AM., Disp: , Rfl:    atenolol (TENORMIN) 25 MG tablet, Take 25 mg by mouth daily., Disp: , Rfl:    atorvastatin (LIPITOR) 80 MG tablet, Take 80 mg by mouth at  bedtime., Disp: , Rfl:    busPIRone (BUSPAR) 30 MG tablet, Take 1 tablet (30 mg total) by mouth 2 (two) times daily., Disp: 180 tablet, Rfl: 1   celecoxib (CELEBREX) 200 MG capsule, Take 200 mg by mouth daily., Disp: , Rfl:    doxycycline (VIBRAMYCIN) 100 MG capsule, Take 1 capsule (100 mg total) by mouth 2 (two) times daily., Disp: 20 capsule, Rfl: 0   escitalopram (LEXAPRO) 10 MG tablet, Take 1 tablet (10 mg total) by mouth daily., Disp: 90 tablet, Rfl: 1   furosemide (LASIX) 20 MG tablet, Take 20 mg by mouth daily., Disp: , Rfl:    Multiple Vitamins-Minerals (ZINC PO), Take 1 tablet by mouth daily at 6 (six) AM., Disp: , Rfl:    rOPINIRole (REQUIP) 0.25 MG tablet, Take 2 tablets (0.5 mg total) by mouth at bedtime. Take 1 tab po nightly for 3 days, then go up to 2 tabs po nightly, Disp: 60 tablet, Rfl: 2   sulfamethoxazole-trimethoprim (BACTRIM DS) 800-160 MG tablet, Take 1 tablet by mouth 2 (two) times daily for 7 days., Disp: 14 tablet, Rfl: 0   VITAMIN D PO, Take 1 tablet by mouth daily at 6 (six)  AM., Disp: , Rfl:    Review of Systems Review of Systems  Constitutional:  Negative for chills and fever.  Respiratory:  Negative for cough and shortness of breath.   Cardiovascular:  Positive for leg swelling. Negative for chest pain and palpitations.  Neurological:  Negative for dizziness, weakness and headaches.       Objective:    Vitals BP 130/80   Pulse 84   Temp 98 F (36.7 C)   Resp 16   Ht '5\' 9"'$  (1.753 m)   Wt 265 lb (120.2 kg)   SpO2 98%   BMI 39.13 kg/m    Physical Examination Physical Exam Constitutional:      Appearance: Normal appearance. She is not ill-appearing.  Cardiovascular:     Rate and Rhythm: Normal rate and regular rhythm.     Pulses: Normal pulses.     Heart sounds: No murmur heard.    No friction rub. No gallop.  Pulmonary:     Effort: Pulmonary effort is normal. No respiratory distress.     Breath sounds: No wheezing, rhonchi or rales.   Abdominal:     General: Bowel sounds are normal. There is no distension.     Palpations: Abdomen is soft.     Tenderness: There is no abdominal tenderness.  Musculoskeletal:     Right lower leg: No edema.     Left lower leg: No edema.  Skin:    General: Skin is warm and dry.     Findings: Lesion present.     Comments: She has venous insufficiency ulcers located on her bilateral legs left worse than right.  Neurological:     Mental Status: She is alert.        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  I am going to refer her over to the wound care clinic at this time.    No follow-ups on file.   Townsend Roger, MD

## 2022-04-07 ENCOUNTER — Other Ambulatory Visit: Payer: Self-pay | Admitting: Internal Medicine

## 2022-04-22 ENCOUNTER — Other Ambulatory Visit: Payer: Self-pay | Admitting: Internal Medicine

## 2022-05-02 ENCOUNTER — Encounter: Payer: Commercial Managed Care - PPO | Admitting: Internal Medicine

## 2022-06-02 ENCOUNTER — Other Ambulatory Visit: Payer: Self-pay | Admitting: Internal Medicine

## 2022-07-01 ENCOUNTER — Encounter: Payer: Self-pay | Admitting: Internal Medicine

## 2022-07-01 ENCOUNTER — Ambulatory Visit: Payer: Commercial Managed Care - PPO | Admitting: Internal Medicine

## 2022-07-01 VITALS — BP 158/90 | HR 97 | Temp 97.9°F | Resp 16 | Ht 69.0 in | Wt 254.2 lb

## 2022-07-01 DIAGNOSIS — L97929 Non-pressure chronic ulcer of unspecified part of left lower leg with unspecified severity: Secondary | ICD-10-CM

## 2022-07-01 DIAGNOSIS — I83029 Varicose veins of left lower extremity with ulcer of unspecified site: Secondary | ICD-10-CM | POA: Insufficient documentation

## 2022-07-01 DIAGNOSIS — I83892 Varicose veins of left lower extremities with other complications: Secondary | ICD-10-CM | POA: Diagnosis not present

## 2022-07-01 DIAGNOSIS — R600 Localized edema: Secondary | ICD-10-CM | POA: Diagnosis not present

## 2022-07-01 MED ORDER — TRAMADOL HCL 50 MG PO TABS: 50.0000 mg | ORAL_TABLET | Freq: Three times a day (TID) | ORAL | 0 refills | Status: DC | PRN

## 2022-07-01 NOTE — Assessment & Plan Note (Signed)
She has been taking lasix as needed but I asked her to stop this.  We will refer her to the wound care clinic where needs either [facility] boot or triple elastic wrappings.  She can take tylenol as needed.  She needs to elevate her feet.

## 2022-07-01 NOTE — Progress Notes (Signed)
Office Visit  Subjective   Patient ID: [name] [name]   DOB: 1961-01-03   Age: 62 y.o.   MRN: [id number]   Chief Complaint Chief Complaint  Patient presents with   Acute Visit    Leg ulcers     History of Present Illness Mrs. Rebel is a 62 yo female who comes in today with chronic venous stasis with a large venous stasis ulcer of her left shin/leg.  This occurred about 2 weeks ago and she has been cover this with bandages and wrapping it with an ACE bandage.  However she states it has worsened and is now weeping.  I saw her in 02/2022 where she was noted to have chronic venous insufficiency with worsening lower extremity edema and venous stasis ulcer of her left ankle/lower leg and some on the posterior part of her right leg.  She was trying to use compression hose but it did not help.  She had prior swelling the month before  that resolved but she has noted that her left leg is always worse than her right leg.  We did obtain a venous doppler US of her left leg on 03/09/2021 and this was negative for DVT.  Her swelling has come back and she has also noted increased varicose veins.  She purchased an [employer] and this area did heal.  It has not recurred.     Past Medical History Past Medical History:  Diagnosis Date   Anxiety    on meds   Cellulitis of left lower extremity 06/21/2017   Hyperlipidemia 06/21/2017   on meds   Hypertension    on meds   Osteoarthritis      Allergies No Known Allergies   Medications  Current Outpatient Medications:    ALPRAZolam (XANAX) 0.5 MG tablet, Take 1 tablet (0.5 mg total) by mouth daily as needed for anxiety., Disp: 30 tablet, Rfl: 2   Ascorbic Acid (VITAMIN C PO), Take 1 tablet by mouth daily at 6 (six) AM., Disp: , Rfl:    atenolol (TENORMIN) 25 MG tablet, TAKE 1 TABLET BY MOUTH EVERY DAY, Disp: 90 tablet, Rfl: 1   atorvastatin (LIPITOR) 80 MG tablet, TAKE 1 TABLET BY MOUTH EVERYDAY AT BEDTIME, Disp: 90 tablet, Rfl: 1    busPIRone (BUSPAR) 30 MG tablet, TAKE 1 TABLET BY MOUTH 2 TIMES DAILY., Disp: 180 tablet, Rfl: 0   celecoxib (CELEBREX) 200 MG capsule, TAKE 1 CAPSULE BY MOUTH EVERY DAY, Disp: 90 capsule, Rfl: 3   doxycycline (VIBRAMYCIN) 100 MG capsule, Take 1 capsule (100 mg total) by mouth 2 (two) times daily., Disp: 20 capsule, Rfl: 0   escitalopram (LEXAPRO) 10 MG tablet, TAKE 1 TABLET BY MOUTH EVERY DAY, Disp: 90 tablet, Rfl: 0   furosemide (LASIX) 20 MG tablet, TAKE 1 TABLET BY MOUTH EVERY DAY AS DIRECTED, Disp: 90 tablet, Rfl: 1   Multiple Vitamins-Minerals (ZINC PO), Take 1 tablet by mouth daily at 6 (six) AM., Disp: , Rfl:    rOPINIRole (REQUIP) 0.25 MG tablet, TAKE 2 TABLETS (0.5 MG TOTAL) BY MOUTH AT BEDTIME. TAKE 1 TAB NIGHTLY FOR 3 DAYS, THEN GO UP TO 2 TABS NIGHTLY, Disp: 180 tablet, Rfl: 1   VITAMIN D PO, Take 1 tablet by mouth daily at 6 (six) AM., Disp: , Rfl:    Review of Systems Review of Systems  Constitutional:  Negative for chills and fever.  Respiratory:  Negative for cough and shortness of breath.   Cardiovascular:  Positive  for leg swelling. Negative for chest pain and palpitations.  Gastrointestinal:  Negative for abdominal pain, constipation, diarrhea, nausea and vomiting.  Musculoskeletal:  Negative for myalgias.  Neurological:  Negative for dizziness, weakness and headaches.       Objective:    Vitals BP (!) 158/90   Pulse 97   Temp 97.9 F (36.6 C)   Resp 16   Ht 5\' 9"  (1.753 m)   Wt 254 lb 3.2 oz (115.3 kg)   SpO2 99%   BMI 37.54 kg/m    Physical Examination Physical Exam Constitutional:      Appearance: Normal appearance. She is not ill-appearing.  Cardiovascular:     Rate and Rhythm: Normal rate and regular rhythm.     Pulses: Normal pulses.     Heart sounds: No murmur heard.    No friction rub. No gallop.  Pulmonary:     Effort: Pulmonary effort is normal. No respiratory distress.     Breath sounds: No wheezing, rhonchi or rales.  Abdominal:      General: Bowel sounds are normal. There is no distension.     Palpations: Abdomen is soft.     Tenderness: There is no abdominal tenderness.  Musculoskeletal:     Right lower leg: No edema.     Left lower leg: No edema.     Comments: She has a large venous stasis ulcer of the left lower extremity encompassing her anterior foot to there anterior mid shin.  Skin:    General: Skin is warm and dry.     Findings: No rash.  Neurological:     Mental Status: She is alert.        Assessment & Plan:   Venous stasis ulcer of left lower leg with edema of left lower leg (HCC) She has been taking lasix as needed but I asked her to stop this.  We will refer her to the wound care clinic where needs either [facility] boot or triple elastic wrappings.  She can take tylenol as needed.  She needs to elevate her feet.    No follow-ups on file.   Crist Fat, MD

## 2022-07-08 ENCOUNTER — Encounter: Payer: Commercial Managed Care - PPO | Attending: Physician Assistant | Admitting: Physician Assistant

## 2022-07-08 DIAGNOSIS — L03116 Cellulitis of left lower limb: Secondary | ICD-10-CM | POA: Insufficient documentation

## 2022-07-08 DIAGNOSIS — I87332 Chronic venous hypertension (idiopathic) with ulcer and inflammation of left lower extremity: Secondary | ICD-10-CM | POA: Diagnosis present

## 2022-07-08 DIAGNOSIS — I872 Venous insufficiency (chronic) (peripheral): Secondary | ICD-10-CM | POA: Diagnosis not present

## 2022-07-08 DIAGNOSIS — I890 Lymphedema, not elsewhere classified: Secondary | ICD-10-CM | POA: Diagnosis not present

## 2022-07-08 DIAGNOSIS — I10 Essential (primary) hypertension: Secondary | ICD-10-CM | POA: Diagnosis not present

## 2022-07-08 DIAGNOSIS — L97822 Non-pressure chronic ulcer of other part of left lower leg with fat layer exposed: Secondary | ICD-10-CM | POA: Diagnosis not present

## 2022-07-11 NOTE — Progress Notes (Signed)
SREENIDHI, GANSON ([id number]) [id number] Page 1 of 5 Visit Report for 07/08/2022 Abuse Risk Screen Details Patient Name: Date of Service: [name] [name], [name] [name] 07/08/2022 11:15 A M Medical Record Number: [id number] Patient Account Number: [id number] Date of Birth/Sex: Treating RN: April 07, 1960 (62 y.o. Ginette Pitman Primary Care Keani Gotcher: Leonia Reader, Barbara Cower Other Clinician: Referring Jalayia Bagheri: Treating Lanie Schelling/Extender: Babs Bertin Weeks in Treatment: 0 Abuse Risk Screen Items Answer Electronic Signature(s) Signed: 07/11/2022 5:22:58 PM By: Midge Aver MSN RN CNS WTA Entered By: Midge Aver on 07/08/2022 12:02:44 -------------------------------------------------------------------------------- Activities of Daily Living Details Patient Name: Date of Service: [name] [name], [name] [name] 07/08/2022 11:15 A M Medical Record Number: [id number] Patient Account Number: [id number] Date of Birth/Sex: Treating RN: 1960-06-28 (61 y.o. Ginette Pitman Primary Care Camri Molloy: [place]onia Reader, Barbara Cower Other Clinician: Referring Jaziel Bennett: Treating Strummer Canipe/Extender: Babs Bertin Weeks in Treatment: 0 Activities of Daily Living Items Answer Activities of Daily Living (Please select one for each item) Drive Automobile Completely Able T Medications ake Completely Able Use T elephone Completely Able Care for Appearance Completely Able Use T oilet Completely Able Bath / Shower Completely Able Dress Self Completely Able Feed Self Completely Able Walk Completely Able Get In / Out Bed Completely Able Housework Completely Able Prepare Meals Completely Able Handle Money Completely Able Shop for Self Completely [name], [name] ([id number]) [id number] [id number] Page 2 of 5 Electronic Signature(s) Signed: 07/11/2022 5:22:58 PM By: Midge Aver MSN RN CNS WTA Entered By: Midge Aver on 07/08/2022  11:42:32 -------------------------------------------------------------------------------- Education Screening Details Patient Name: Date of Service: [name] [name], [name] [name] 07/08/2022 11:15 A M Medical Record Number: [id number] Patient Account Number: [id number] Date of Birth/Sex: Treating RN: [birth year] (61 y.o. Ginette Pitman Primary Care Jakoby Melendrez: Leonia Reader, Barbara Cower Other Clinician: Referring Aamilah Augenstein: Treating Jannely Henthorn/Extender: Thornell Mule in Treatment: 0 Learning Preferences/Education Level/Primary Language Learning Preference: Explanation, Demonstration Highest Education Level: College or Above Preferred Language: English Cognitive Barrier Language Barrier: No Translator Needed: No Memory Deficit: No Emotional Barrier: No Cultural/Religious Beliefs Affecting Medical Care: No Physical Barrier Impaired Vision: Yes Glasses Impaired Hearing: No Decreased Hand dexterity: No Knowledge/Comprehension Knowledge Level: High Comprehension Level: High Ability to understand written instructions: High Ability to understand verbal instructions: High Motivation Anxiety Level: Calm Cooperation: Cooperative Education Importance: Acknowledges Need Interest in Health Problems: Asks Questions Perception: Coherent Willingness to Engage in Self-Management High Activities: Readiness to Engage in Self-Management High Activities: Electronic Signature(s) Signed: 07/11/2022 5:22:58 PM By: Midge Aver MSN RN CNS WTA Entered By: Midge Aver on 07/08/2022 11:42:59 [name] ([id number]) [id number] [id number].pdf Page 3 of 5 -------------------------------------------------------------------------------- Fall Risk Assessment Details Patient Name: Date of Service: [name] [name], [name] [name] 07/08/2022 11:15 A M Medical Record Number: [id number] Patient Account Number: [id number] Date of Birth/Sex: Treating RN: 05/04/1960 (62 y.o. Ginette Pitman Primary Care Liticia Gasior:  Leonia Reader, Barbara Cower Other Clinician: Referring Maclovio Henson: Treating Gil Ingwersen/Extender: Thornell Mule in Treatment: 0 Fall Risk Assessment Items Have you had 2 or more falls in the last 12 monthso 0 Yes Have you had any fall that resulted in injury in the last 12 monthso 0 No FALLS RISK SCREEN History of falling - immediate or within 3 months 0 No Secondary diagnosis (Do you have 2 or more medical diagnoseso) 0 No Ambulatory aid None/bed rest/wheelchair/nurse 0 No Crutches/cane/walker 0 No Furniture 0 No Intravenous therapy Access/Saline/Heparin Lock 0 No Gait/Transferring Normal/ bed rest/ wheelchair 0 No Weak (short steps with or without  shuffle, stooped but able to lift head while walking, may seek 0 No support from furniture) Impaired (short steps with shuffle, may have difficulty arising from chair, head down, impaired 0 No balance) Mental Status Oriented to own ability 0 Yes Electronic Signature(s) Signed: 07/11/2022 5:22:58 PM By: Midge Aver MSN RN CNS WTA Entered By: Midge Aver on 07/08/2022 11:43:40 -------------------------------------------------------------------------------- Foot Assessment Details Patient Name: Date of Service: [name] [name], [name] [name] 07/08/2022 11:15 A M Medical Record Number: [id number] Patient Account Number: [id number] Date of Birth/Sex: Treating RN: 08/17/1960 (61 y.o. Ginette Pitman Primary Care Wynter Isaacs: Leonia Reader, Barbara Cower Other Clinician: Referring Edison Wollschlager: Treating Montrail Mehrer/Extender: Thornell Mule in Treatment: 0 Foot Assessment Items Site Locations [place], [place] ([id number]) [id number] [id number] Page 4 of 5 + = Sensation present, - = Sensation absent, C = Callus, U = Ulcer R = Redness, W = Warmth, M = Maceration, PU = Pre-ulcerative lesion F = Fissure, S = Swelling, D = Dryness Assessment Right: Left: Other Deformity: No No Prior Foot Ulcer: No No Prior Amputation: No No Charcot  Joint: No No Ambulatory Status: Ambulatory Without Help Gait: Steady Electronic Signature(s) Signed: 07/11/2022 5:22:58 PM By: Midge Aver MSN RN CNS WTA Entered By: Midge Aver on 07/08/2022 11:44:17 -------------------------------------------------------------------------------- Nutrition Risk Screening Details Patient Name: Date of Service: [name] [name], [name] [name] 07/08/2022 11:15 A M Medical Record Number: [id number] Patient Account Number: [id number] Date of Birth/Sex: Treating RN: [birth year] (61 y.o. Ginette Pitman Primary Care Emari Demmer: Leonia Reader, Barbara Cower Other Clinician: Referring Sojourner Behringer: Treating Kimbely Whiteaker/Extender: Babs Bertin Weeks in Treatment: 0 Height (in): 69 Weight (lbs): 254 Body Mass Index (BMI): 37.5 Nutrition Risk Screening Items Score Screening NUTRITION RISK SCREEN: I have an illness or condition that made me change the kind and/or amount of food I eat 0 No I eat fewer than two meals per day 0 No I eat few fruits and vegetables, or milk products 0 No I have three or more drinks of beer, liquor or wine almost every day 0 No I have tooth or mouth problems that make it hard for me to eat 0 No I don't always have enough money to buy the food I need 0 No [place], [name] ([id number]) Karna Christmas Nursing_[id number].pdf Page 5 of 5 I eat alone most of the time 0 No I take three or more different prescribed or over-the-counter drugs a day 1 Yes Without wanting to, I have lost or gained 10 pounds in the last six months 0 No I am not always physically able to shop, cook and/or feed myself 0 No Nutrition Protocols Good Risk Protocol 0 No interventions needed Moderate Risk Protocol High Risk Proctocol Risk Level: Good Risk Score: 1 Electronic Signature(s) Signed: 07/11/2022 5:22:58 PM By: Midge Aver MSN RN CNS WTA Entered By: Midge Aver on 07/08/2022 11:44:05

## 2022-07-11 NOTE — Progress Notes (Signed)
SHAQUERA, ANSLEY ([id number]) [id number] Page 1 of 10 Visit Report for 07/08/2022 Allergy List Details Patient Name: Date of Service: [name] [name], [place] 07/08/2022 11:15 A M Medical Record Number: [id number] Patient Account Number: [id number] Date of Birth/Sex: Treating RN: [birth year] (62 y.o. Ginette Pitman Primary Care Pryce Folts: Leonia Reader, Barbara Cower Other Clinician: Referring Delita Chiquito: Treating Sakura Denis/Extender: Babs Bertin Weeks in Treatment: 0 Allergies Active Allergies No Known Allergies Allergy Notes Electronic Signature(s) Signed: 07/11/2022 5:22:58 PM By: Midge Aver MSN RN CNS WTA Entered By: Midge Aver on 07/08/2022 12:02:20 -------------------------------------------------------------------------------- Arrival Information Details Patient Name: Date of Service: [name] Dell, [place] RY 07/08/2022 11:15 A M Medical Record Number: [id number] Patient Account Number: [id number] Date of Birth/Sex: Treating RN: 02-22-1960 (62 y.o. Ginette Pitman Primary Care Darshana Curnutt: Leonia Reader, Barbara Cower Other Clinician: Referring Zurich Carreno: Treating Deaundre Allston/Extender: Thornell Mule in Treatment: 0 Visit Information Patient Arrived: Ambulatory Arrival Time: 11:36 Accompanied By: self Transfer Assistance: None Patient Identification Verified: Yes Secondary Verification Process Completed: Yes Patient Requires Transmission-Based Precautions: No Patient Has Alerts: Yes Patient Alerts: Not Diabetic Electronic Signature(s) Signed: 07/11/2022 5:22:58 PM By: Midge Aver MSN RN CNS WTA Entered By: Midge Aver on 07/08/2022 11:37:02 [name] [name] ([id number]) [id number] Page 2 of 10 -------------------------------------------------------------------------------- Clinic Level of Care Assessment Details Patient Name: Date of Service: [name] [name], [place] 07/08/2022 11:15 A M Medical Record Number: [id number] Patient Account Number:  [id number] Date of Birth/Sex: Treating RN: 05-29-1960 (62 y.o. Ginette Pitman Primary Care Malek Skog: Leonia Reader, Barbara Cower Other Clinician: Referring Keyvin Rison: Treating Cindie Rajagopalan/Extender: Thornell Mule in Treatment: 0 Clinic Level of Care Assessment Items TOOL 2 Quantity Score X- 1 0 Use when only an EandM is performed on the INITIAL visit ASSESSMENTS - Nursing Assessment / Reassessment X- 1 20 General Physical Exam (combine w/ comprehensive assessment (listed just below) when performed on new pt. evals) X- 1 25 Comprehensive Assessment (HX, ROS, Risk Assessments, Wounds Hx, etc.) ASSESSMENTS - Wound and Skin A ssessment / Reassessment X - Simple Wound Assessment / Reassessment - one wound 1 5 []  - 0 Complex Wound Assessment / Reassessment - multiple wounds []  - 0 Dermatologic / Skin Assessment (not related to wound area) ASSESSMENTS - Ostomy and/or Continence Assessment and Care []  - 0 Incontinence Assessment and Management []  - 0 Ostomy Care Assessment and Management (repouching, etc.) PROCESS - Coordination of Care X - Simple Patient / Family Education for ongoing care 1 15 []  - 0 Complex (extensive) Patient / Family Education for ongoing care X- 1 10 Staff obtains Chiropractor, Records, T Results / Process Orders est []  - 0 Staff telephones HHA, Nursing Homes / Clarify orders / etc []  - 0 Routine Transfer to another Facility (non-emergent condition) []  - 0 Routine Hospital Admission (non-emergent condition) X- 1 15 New Admissions / Manufacturing engineer / Ordering NPWT Apligraf, etc. , []  - 0 Emergency Hospital Admission (emergent condition) []  - 0 Simple Discharge Coordination []  - 0 Complex (extensive) Discharge Coordination PROCESS - Special Needs []  - 0 Pediatric / Minor Patient Management []  - 0 Isolation Patient Management []  - 0 Hearing / Language / Visual special needs []  - 0 Assessment of Community assistance (transportation, D/C  planning, etc.) []  - 0 Additional assistance / Altered mentation []  - 0 Support Surface(s) Assessment (bed, cushion, seat, etc.) INTERVENTIONS - Wound Cleansing / Measurement [name] [name], [name] [name] ([id number]) [id number] Page 3 of 10 X- 1 5 Wound Imaging (photographs - any number of wounds) []  -  0 Wound Tracing (instead of photographs) X- 1 5 Simple Wound Measurement - one wound []  - 0 Complex Wound Measurement - multiple wounds X- 1 5 Simple Wound Cleansing - one wound []  - 0 Complex Wound Cleansing - multiple wounds INTERVENTIONS - Wound Dressings []  - 0 Small Wound Dressing one or multiple wounds []  - 0 Medium Wound Dressing one or multiple wounds X- 1 20 Large Wound Dressing one or multiple wounds []  - 0 Application of Medications - injection INTERVENTIONS - Miscellaneous []  - 0 External ear exam []  - 0 Specimen Collection (cultures, biopsies, blood, body fluids, etc.) []  - 0 Specimen(s) / Culture(s) sent or taken to Lab for analysis []  - 0 Patient Transfer (multiple staff / Nurse, adult / Similar devices) []  - 0 Simple Staple / Suture removal (25 or less) []  - 0 Complex Staple / Suture removal (26 or more) []  - 0 Hypo / Hyperglycemic Management (close monitor of Blood Glucose) X- 1 15 Ankle / Brachial Index (ABI) - do not check if billed separately Has the patient been seen at the hospital within the last three years: Yes Total Score: 140 Level Of Care: New/Established - Level 4 Electronic Signature(s) Signed: 07/11/2022 5:22:58 PM By: Midge Aver MSN RN CNS WTA Entered By: Midge Aver on 07/08/2022 12:34:20 -------------------------------------------------------------------------------- Encounter Discharge Information Details Patient Name: Date of Service: [name] Dell, MA RY 07/08/2022 11:15 A M Medical Record Number: [id number] Patient Account Number: [id number] Date of Birth/Sex: Treating RN: [birth year] (62 y.o. Ginette Pitman Primary  Care Matheu Ploeger: Leonia Reader, Barbara Cower Other Clinician: Referring Anne Sebring: Treating Jalayiah Bibian/Extender: Thornell Mule in Treatment: 0 Encounter Discharge Information Items Post Procedure Vitals Discharge Condition: Stable Temperature (F): 98.1 Ambulatory Status: Ambulatory Pulse (bpm): 77 Discharge Destination: Home Respiratory Rate (breaths/min): 16 Transportation: Private Auto Blood Pressure (mmHg): 147/87 Accompanied By: self Schedule Follow-up Appointment: Yes Clinical Summary of CareATISHA, [name] [name] ([id number]) [id number] Page 4 of 10 Electronic Signature(s) Signed: 07/11/2022 5:22:58 PM By: Midge Aver MSN RN CNS WTA Entered By: Midge Aver on 07/08/2022 12:46:42 -------------------------------------------------------------------------------- Lower Extremity Assessment Details Patient Name: Date of Service: [name] [name], [name] 07/08/2022 11:15 A M Medical Record Number: [id number] Patient Account Number: [id number] Date of Birth/Sex: Treating RN: 05/22/1960 (61 y.o. Ginette Pitman Primary Care Anabella Capshaw: Leonia Reader, Barbara Cower Other Clinician: Referring Cylie Dor: Treating [name] [name]/Extender: Babs Bertin Weeks in Treatment: 0 Edema Assessment Assessed: [Left: No] [Right: No] [Left: Edema] [Right: :] Calf Left: Right: Point of Measurement: 34 cm From Medial Instep 52.2 cm Ankle Left: Right: Point of Measurement: 12 cm From Medial Instep 32.7 cm Knee To Floor Left: Right: From Medial Instep 44 cm Vascular Assessment Blood Pressure: Brachial: [Left:147] [Right:147] Ankle: [Left:Dorsalis Pedis: 110 0.75] [Right:Dorsalis Pedis: 120 0.82] Electronic Signature(s) Signed: 07/11/2022 5:22:58 PM By: Midge Aver MSN RN CNS WTA Entered By: Midge Aver on 07/08/2022 12:02:12 -------------------------------------------------------------------------------- Multi Wound Chart Details Patient Name: Date of Service: [name] Dell, MA RY  07/08/2022 11:15 A M Medical Record Number: [id number] Patient Account Number: [id number] ALYLA, PIETILA ([id number]) [id number] Page 5 of 10 Date of Birth/Sex: Treating RN: 04-01-1960 (61 y.o. Ginette Pitman Primary Care Queena Monrreal: Other Clinician: Leonia Reader, Barbara Cower Referring Vere Diantonio: Treating Hollye Pritt/Extender: Thornell Mule in Treatment: 0 Vital Signs Height(in): 69 Pulse(bpm): 77 Weight(lbs): 254 Blood Pressure(mmHg): 147/87 Body Mass Index(BMI): 37.5 Temperature(F): 98.1 Respiratory Rate(breaths/min): 18 [1:Photos:] [N/A:N/A] Left, Circumferential Lower Leg N/A N/A Wound Location: Gradually Appeared N/A N/A Wounding Event: Cellulitis N/A  N/A Primary Etiology: Hypertension N/A N/A Comorbid History: 06/18/2019 N/A N/A Date Acquired: 0 N/A N/A Weeks of Treatment: Open N/A N/A Wound Status: No N/A N/A Wound Recurrence: Yes N/A N/A Clustered Wound: 24.5x33x0.1 N/A N/A Measurements L x W x D (cm) 634.994 N/A N/A A (cm) : rea 63.499 N/A N/A Volume (cm) : Full Thickness Without Exposed N/A N/A Classification: Support Structures Large N/A N/A Exudate Amount: Serous N/A N/A Exudate Type: amber N/A N/A Exudate Color: Medium (34-66%) N/A N/A Granulation Amount: Red, Pink N/A N/A Granulation Quality: Fat Layer (Subcutaneous Tissue): Yes N/A N/A Exposed Structures: Fascia: No Tendon: No Muscle: No Joint: No Bone: No None N/A N/A Epithelialization: Treatment Notes Electronic Signature(s) Signed: 07/11/2022 5:22:58 PM By: Midge Aver MSN RN CNS WTA Entered By: Midge Aver on 07/08/2022 12:11:27 [name] [name] ([id number]) [id number] Page 6 of 10 -------------------------------------------------------------------------------- Multi-Disciplinary Care Plan Details Patient Name: Date of Service: [name] [name], [place] 07/08/2022 11:15 A M Medical Record Number: [id number] Patient Account Number:  [id number] Date of Birth/Sex: Treating RN: [birth year] (62 y.o. Ginette Pitman Primary Care [name] [name]: Leonia Reader, Barbara Cower Other Clinician: Referring Kalyse Meharg: Treating [name] [name]/Extender: Thornell Mule in Treatment: 0 Active Inactive Orientation to the Wound Care Program Nursing Diagnoses: Knowledge deficit related to the wound healing center program Goals: Patient/caregiver will verbalize understanding of the Wound Healing Center Program Date Initiated: 07/08/2022 Target Resolution Date: 07/15/2022 Goal Status: Active Interventions: Provide education on orientation to the wound center Notes: Soft Tissue Infection Nursing Diagnoses: Impaired tissue integrity Potential for infection: soft tissue Goals: Patient/caregiver will verbalize understanding of or measures to prevent infection and contamination in the home setting Date Initiated: 07/08/2022 Target Resolution Date: 08/07/2022 Goal Status: Active Patient's soft tissue infection will resolve Date Initiated: 07/08/2022 Target Resolution Date: 08/07/2022 Goal Status: Active Signs and symptoms of infection will be recognized early to allow for prompt treatment Date Initiated: 07/08/2022 Target Resolution Date: 08/07/2022 Goal Status: Active Interventions: Assess signs and symptoms of infection every visit Provide education on infection Treatment Activities: Systemic antibiotics : 07/08/2022 Notes: Wound/Skin Impairment Nursing Diagnoses: Impaired tissue integrity Knowledge deficit related to smoking impact on wound healing Knowledge deficit related to ulceration/compromised skin integrity Goals: Patient will demonstrate a reduced rate of smoking or cessation of smoking Date Initiated: 07/08/2022 Target Resolution Date: 08/07/2022 Goal StatusCANDIE, [name] [name] ([id number]) [id number] Page 7 of 10 Patient/caregiver will verbalize understanding of skin care regimen Date Initiated:  07/08/2022 Target Resolution Date: 08/07/2022 Goal Status: Active Ulcer/skin breakdown will have a volume reduction of 30% by week 4 Date Initiated: 07/08/2022 Target Resolution Date: 08/07/2022 Goal Status: Active Ulcer/skin breakdown will have a volume reduction of 50% by week 8 Date Initiated: 07/08/2022 Target Resolution Date: 09/07/2022 Goal Status: Active Interventions: Assess patient/caregiver ability to obtain necessary supplies Assess patient/caregiver ability to perform ulcer/skin care regimen upon admission and as needed Assess ulceration(s) every visit Provide education on smoking Provide education on ulcer and skin care Treatment Activities: Referred to DME [name] [name] for dressing supplies : 07/08/2022 Skin care regimen initiated : 07/08/2022 Notes: Electronic Signature(s) Signed: 07/11/2022 5:22:58 PM By: Midge Aver MSN RN CNS WTA Entered By: Midge Aver on 07/08/2022 12:37:12 -------------------------------------------------------------------------------- Pain Assessment Details Patient Name: Date of Service: [name] Dell, MA RY 07/08/2022 11:15 A M Medical Record Number: [id number] Patient Account Number: [id number] Date of Birth/Sex: Treating RN: 1960/08/18 (61 y.o. Ginette Pitman Primary Care [name] [name]: Leonia Reader, Barbara Cower Other Clinician: Referring [name] [name]: Treating Sheily Lineman/Extender: Thornell Mule  in Treatment: 0 Active Problems Location of Pain Severity and Description of Pain Patient Has Paino Yes Site Locations Rate the pain. Current Pain Level: 4 Character of Pain Describe the Pain: [name] [name], [name] [name], [name] [name] ([id number]) [id number] Page 8 of 10 Pain Management and Medication Current Pain Management: Medication: Yes Electronic Signature(s) Signed: 07/11/2022 5:22:58 PM By: Midge Aver MSN RN CNS WTA Entered By: Midge Aver on 07/08/2022  11:39:07 -------------------------------------------------------------------------------- Patient/Caregiver Education Details Patient Name: Date of Service: [name] Dell, MA RY 6/21/2024andnbsp11:15 A M Medical Record Number: [id number] Patient Account Number: [id number] Date of Birth/Gender: Treating RN: 1960-03-16 (62 y.o. Ginette Pitman Primary Care Physician: Leonia Reader, Barbara Cower Other Clinician: Referring Physician: Treating Physician/Extender: Thornell Mule in Treatment: 0 Education Assessment Education Provided To: Patient Education Topics Provided Smoking and Wound Healing: Handouts: Smoking and Wound Healing Methods: Explain/Verbal Responses: State content correctly Welcome T [facility]New Patient Packet: o Handouts: Welcome T [facility] o Methods: Explain/Verbal Responses: State content correctly Wound/Skin Impairment: Handouts: Caring for Your Ulcer Methods: Explain/Verbal Responses: State content correctly Electronic Signature(s) Signed: 07/11/2022 5:22:58 PM By: Midge Aver MSN RN CNS WTA Entered By: Midge Aver on 07/08/2022 12:37:49 [name] [name] ([id number]) [id number] Page 9 of 10 -------------------------------------------------------------------------------- Wound Assessment Details Patient Name: Date of Service: [name] [name], [place] 07/08/2022 11:15 A M Medical Record Number: [id number] Patient Account Number: [id number] Date of Birth/Sex: Treating RN: 1960/11/14 (62 y.o. Ginette Pitman Primary Care Davone Shinault: Leonia Reader, Barbara Cower Other Clinician: Referring Lavonda Thal: Treating Blondine [name]/Extender: Babs Bertin Weeks in Treatment: 0 Wound Status Wound Number: 1 Primary Etiology: Cellulitis Wound Location: Left, Circumferential Lower Leg Wound Status: Open Wounding Event: Gradually Appeared Comorbid History: Hypertension Date Acquired: 06/18/2019 Weeks Of Treatment: 0 Clustered Wound:  Yes Photos Wound Measurements Length: (cm) 24.5 Width: (cm) 33 Depth: (cm) 0.1 Area: (cm) 634.994 Volume: (cm) 63.499 % Reduction in Area: % Reduction in Volume: Epithelialization: None Tunneling: No Undermining: No Wound Description Classification: Full Thickness Without Exposed Support Structures Exudate Amount: Large Exudate Type: Serous Exudate Color: amber Foul Odor After Cleansing: No Slough/Fibrino No Wound Bed Granulation Amount: Medium (34-66%) Exposed Structure Granulation Quality: Red, Pink Fascia Exposed: No Fat Layer (Subcutaneous Tissue) Exposed: Yes Tendon Exposed: No Muscle Exposed: No Joint Exposed: No Bone Exposed: No Treatment Notes Wound #1 (Lower Leg) Wound Laterality: Left, Circumferential Cleanser Soap and Water Discharge Instruction: Gently cleanse wound with antibacterial soap, rinse and pat dry prior to dressing wounds Peri-Wound Care Topical Primary Dressing Secondary Dressing ABD Pad 5x9 (in/in) Discharge Instruction: Cover with ABD pad Kerlix 4.5 x 4.1 (in/yd) Discharge Instruction: Apply Kerlix 4.5 x 4.1 (in/yd) as instructed Secured With [name] [name], [name] [name] ([id number]) [id number] Page 10 of 10 Medipore T - 71M Medipore H Soft Cloth Surgical T ape ape, 2x2 (in/yd) Tubigrip Size E, 3.5x10 (in/yds) Discharge Instruction: Apply 3 Tubigrip E 3-finger-widths below knee to base of toes to secure dressing and/or for swelling. Compression Wrap Compression Stockings Add-Ons Electronic Signature(s) Signed: 07/11/2022 5:22:58 PM By: Midge Aver MSN RN CNS WTA Entered By: Midge Aver on 07/08/2022 11:52:15 -------------------------------------------------------------------------------- Vitals Details Patient Name: Date of Service: [name] Dell, [place] RY 07/08/2022 11:15 A M Medical Record Number: [id number] Patient Account Number: [id number] Date of Birth/Sex: Treating RN: 1960/03/26 (61 y.o. Ginette Pitman Primary Care  Bryker Fletchall: Leonia Reader, Barbara Cower Other Clinician: Referring Andreanna Mikolajczak: Treating Delante Karapetyan/Extender: Babs Bertin Weeks in Treatment: 0 Vital Signs Time Taken: 11:39 Temperature (F): 98.1 Height (  in): 69 Pulse (bpm): 77 Source: Stated Respiratory Rate (breaths/min): 18 Weight (lbs): 254 Blood Pressure (mmHg): 147/87 Body Mass Index (BMI): 37.5 Reference Range: 80 - 120 mg / dl Electronic Signature(s) Signed: 07/11/2022 5:22:58 PM By: Midge Aver MSN RN CNS WTA Entered By: Midge Aver on 07/08/2022 11:39:41

## 2022-07-11 NOTE — Progress Notes (Signed)
PHENIX, GREIN ([id number]) [id number] Page 1 of 9 Visit Report for 07/08/2022 Chief Complaint Document Details Patient Name: Date of Service: [name] [name], [place] 07/08/2022 11:15 A M Medical Record Number: [id number] Patient Account Number: [id number] Date of Birth/Sex: Treating RN: 27-Feb-1960 (62 y.o. [name] [name] Primary Care Provider: Leonia [name], [name] [name] Other Clinician: Referring Provider: Treating Provider/Extender: [name] [name] in Treatment: 0 Information Obtained from: Patient Chief Complaint Left LE Cellulitis Electronic Signature(s) Signed: 07/08/2022 12:09:38 PM By: [name] Derry PA-C Entered By: [name] [name] on 07/08/2022 12:09:38 -------------------------------------------------------------------------------- Debridement Details Patient Name: Date of Service: [name] [name], [name] 07/08/2022 11:15 A M Medical Record Number: [id number] Patient Account Number: [id number] Date of Birth/Sex: Treating RN: 1960-03-26 (61 y.o. [name] [name] Primary Care Provider: Leonia [name], [name] [name] Other Clinician: Referring Provider: Treating Provider/Extender: [name] [name] in Treatment: 0 Debridement Performed for Assessment: Wound #1 Left,Circumferential Lower Leg Performed By: Physician [name] Derry, PA-C Debridement Type: Chemical/Enzymatic/Mechanical Agent Used: gauze Level of Consciousness (Pre-procedure): Awake and Alert Pre-procedure Verification/Time Out Yes - 12:20 Taken: Start Time: 12:20 Percent of Wound Bed Debrided: Instrument: Other : saline gauze Bleeding: None Procedural Pain: 0 Post Procedural Pain: 0 Response to Treatment: Procedure was tolerated well Level of Consciousness (Post- Awake and Alert procedure): Post Debridement Measurements of Total Wound [name], [name] [name] ([id number]) [id number] Page 2 of 9 Length: (cm) 24.5 Width: (cm) 33 Depth: (cm) 0.1 Volume: (cm) 63.499 Character of  Wound/Ulcer Post Debridement: Stable Post Procedure Diagnosis Same as Pre-procedure Electronic Signature(s) Signed: 07/08/2022 1:44:40 PM By: [name] Derry PA-C Signed: 07/11/2022 5:22:58 PM By: [name] Aver MSN RN CNS WTA Entered By: [name] [name] on 07/08/2022 12:30:14 -------------------------------------------------------------------------------- HPI Details Patient Name: Date of Service: [name] Dell, [place] RY 07/08/2022 11:15 A M Medical Record Number: [id number] Patient Account Number: [id number] Date of Birth/Sex: Treating RN: 04/09/1960 (61 y.o. [name] [name] Primary Care Provider: Leonia [name], [name] [name] Other Clinician: Referring Provider: Treating Provider/Extender: [name] [name] in Treatment: 0 History of Present Illness HPI Description: 07-08-2022 upon evaluation today patient presents for initial inspection here in our clinic she was actually referred by primary care she actually appears to have more cellulitis than a true wound on the left lower extremity. This is hot and warm to touch it is draining that there is not really a good place I can obtain a wound culture from May which is showing more superficial bacteria which would not really be terribly helpful at this point. For that reason what I can do anything in that regard. The patient tells me this is an intermittent history over the past 3 years of this occurring she has not been on antibiotics for this episode. With that being said she tells me that this is hurting quite a bit she was given tramadol before she was referred to [place]. She does have a history of chronic venous insufficiency for sure she also has what appears to be lymphedema based on what I am seeing. Other than that she does have hypertension no other major medical problems. Electronic Signature(s) Signed: 07/08/2022 12:24:41 PM By: [name] Derry PA-C Entered By: [name] [name] on 07/08/2022  12:24:41 -------------------------------------------------------------------------------- Physical Exam Details Patient Name: Date of Service: [name] [name], [place] 07/08/2022 11:15 A M Medical Record Number: [id number] Patient Account Number: [id number] Date of Birth/Sex: Treating RN: 11/23/1960 (61 y.o. [name] [name] Primary Care Provider: Leonia [name], [name] [name] Other Clinician: Referring Provider: Treating Provider/Extender: [name] [name]  in TreatmentIzora Gala Chena [name], [name] [name] ([id number]) [id number] Page 3 of 9 Constitutional patient is hypertensive.. pulse regular and within target range for patient.Marland Kitchen respirations regular, non-labored and within target range for patient.Marland Kitchen temperature within target range for patient.. Obese and well-hydrated in no acute distress. Eyes conjunctiva clear no eyelid edema noted. pupils equal round and reactive to light and accommodation. Ears, Nose, Mouth, and Throat no gross abnormality of ear auricles or external auditory canals. normal hearing noted during conversation. mucus membranes moist. Respiratory normal breathing without difficulty. Cardiovascular 1+ dorsalis pedis/posterior tibialis pulses. 1+ pitting edema of the bilateral lower extremities. Musculoskeletal normal gait and posture. no significant deformity or arthritic changes, no loss or range of motion, no clubbing. Psychiatric this patient is able to make decisions and demonstrates good insight into disease process. Alert and Oriented x 3. pleasant and cooperative. Notes Upon inspection patient's wound bed actually showed signs really being more cellulitis of left lower extremity versus anything else right leg looks good at this point. 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 Nonetheless I think the Bactrim is a very good option for [place] to start with at this point and she is in agreement with that plan. She does work as a Buyer, retail. Psychologist, prison and probation services) Signed: 07/08/2022 12:25:34 PM By: [name] Derry PA-C Entered By: [name] [name] on 07/08/2022 12:25:34 -------------------------------------------------------------------------------- Physician Orders Details Patient Name: Date of Service: [name] Dell, [place] RY 07/08/2022 11:15 A M Medical Record Number: [id number] Patient Account Number: [id number] Date of Birth/Sex: Treating RN: 26-Apr-1960 (61 y.o. 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 Bathing/ Shower/ Hygiene May shower; gently cleanse wound with antibacterial soap, rinse and pat dry prior to dressing wounds Edema Control - Lymphedema / Segmental Compressive Device / SPRUHA, WEIGHT ([id number]) [id number] Page 4 of 9 Tubigrip single layer applied. - E Wound Treatment Wound #1 - Lower Leg Wound Laterality: Left, Circumferential Cleanser: Soap and Water 1 x Per Day/30 Days Discharge Instructions: Gently cleanse wound with antibacterial soap, rinse and pat dry prior to dressing  wounds Secondary Dressing: ABD Pad 5x9 (in/in) 1 x Per Day/30 Days Discharge Instructions: Cover with ABD pad Secondary Dressing: Kerlix 4.5 x 4.1 (in/yd) (DME) (Generic) 1 x Per Day/30 Days Discharge Instructions: Apply Kerlix 4.5 x 4.1 (in/yd) as instructed Secured With: Medipore T - 31M Medipore H Soft Cloth Surgical T ape ape, 2x2 (in/yd) (DME) (Generic) 1 x Per Day/30 Days Secured With: Tubigrip Size E, 3.5x10 (in/yds) 1 x Per Day/30 Days Discharge Instructions: Apply 3 Tubigrip E 3-finger-widths below knee to base of toes to secure dressing and/or for swelling. Patient Medications llergies:  No Known Allergies A Notifications Medication Indication Start End 07/08/2022 Bactrim DS DOSE 1 - oral 800 mg-160 mg tablet - 1 tablet oral twice a day x 14 days Electronic Signature(s) Signed: 07/08/2022 1:44:40 PM By: [name] Derry PA-C Signed: 07/11/2022 5:22:58 PM By: [name] Aver MSN RN CNS WTA Previous Signature: 07/08/2022 12:44:21 PM Version By: [name] Derry PA-C Entered By: [name] [name] on 07/08/2022 12:46:25 -------------------------------------------------------------------------------- Problem List Details Patient Name: Date of Service: [name] Dell, [place] RY 07/08/2022 11:15 A M Medical Record Number: [id number] Patient Account Number: [id number] Date of Birth/Sex: Treating RN: 22-Jun-1960 (61 y.o. [name] [name] Primary Care Provider: Leonia [name], [name] [name] Other Clinician: Referring Provider: Treating Provider/Extender: Babs Bertin Weeks in Treatment: 0 Active Problems ICD-10 Encounter Code Description Active Date MDM Diagnosis L03.116 Cellulitis of left lower limb 07/08/2022 No Yes I87.332 Chronic venous hypertension (idiopathic) with ulcer and inflammation of left 07/08/2022 No Yes lower extremity I89.0 Lymphedema, not elsewhere classified 07/08/2022 No Yes [name] [name], [name] [name] ([id number]) [id number] Page 5 of 9 [phone] Non-pressure chronic ulcer of other  part of left lower leg with fat layer exposed6/21/2024 No Yes I10 Essential (primary) hypertension 07/08/2022 No Yes Inactive Problems Resolved Problems Electronic Signature(s) Signed: 07/08/2022 12:24:32 PM By: [name] Derry PA-C Previous Signature: 07/08/2022 12:09:17 PM Version By: [name] Derry PA-C Entered By: [name] [name] on 07/08/2022 12:24:32 -------------------------------------------------------------------------------- Progress Note Details Patient Name: Date of Service: [name] Dell, [place] RY 07/08/2022 11:15 A M Medical Record Number: [id number] Patient Account Number: [id number] Date of Birth/Sex: Treating RN: 1960-11-21 (61 y.o. [name] [name] Primary Care Provider: Leonia [name], [name] [name] Other Clinician: Referring Provider: Treating Provider/Extender: Babs Bertin Weeks in Treatment: 0 Subjective Chief Complaint Information obtained from Patient Left LE Cellulitis History of Present Illness (HPI) 07-08-2022 upon evaluation today patient presents for initial inspection here in our clinic she was actually referred by primary care she actually appears to have more cellulitis than a true wound on the left lower extremity. This is hot and warm to touch it is draining that there is not really a good place I can obtain a wound culture from May which is showing more superficial bacteria which would not really be terribly helpful at this point. For that reason what I can do anything in that regard. The patient tells me this is an intermittent history over the past 3 years of this occurring she has not been on antibiotics for this episode. With that being said she tells me that this is hurting quite a bit she was given tramadol before she was referred to [place]. She does have a history of chronic venous insufficiency for sure she also has what appears to be lymphedema based on what I am seeing. Other than that she does have hypertension no other major medical problems. Patient  History Allergies No Known Allergies Social History Current every day smoker - 1 ppd, Marital Status - Married, Alcohol Use - Never, Drug Use - No History, Caffeine Use - Moderate. Medical History Cardiovascular Patient has history of Hypertension Endocrine Denies history of Type I Diabetes, Type II Diabetes Review of Systems (ROS) Eyes Complains or has symptoms of Glasses / Contacts. Denies complaints or symptoms of Dry Eyes, Vision Changes. Gastrointestinal Denies complaints or symptoms of Frequent diarrhea, Nausea, Vomiting. Endocrine Denies complaints or symptoms of Hepatitis, Thyroid disease, Polydypsia (Excessive Thirst). [name] ([id number]) [id number] Page 6 of 9 Denies complaints or symptoms of Kidney failure/ Dialysis, Incontinence/dribbling. Immunological Denies complaints or symptoms of Hives,  Itching. Integumentary (Skin) Denies complaints or symptoms of Wounds, Bleeding or bruising tendency, Breakdown, Swelling. Musculoskeletal Denies complaints or symptoms of Muscle Pain, Muscle Weakness. Neurologic Denies complaints or symptoms of Numbness/parasthesias, Focal/Weakness. Psychiatric Denies complaints or symptoms of Anxiety, Claustrophobia. Objective Constitutional patient is hypertensive.. pulse regular and within target range for patient.Marland Kitchen respirations regular, non-labored and within target range for patient.Marland Kitchen temperature within target range for patient.. Obese and well-hydrated in no acute distress. Vitals Time Taken: 11:39 AM, Height: 69 in, Source: Stated, Weight: 254 lbs, BMI: 37.5, Temperature: 98.1 F, Pulse: 77 bpm, Respiratory Rate: 18 breaths/min, Blood Pressure: 147/87 mmHg. Eyes conjunctiva clear no eyelid edema noted. pupils equal round and reactive to light and accommodation. Ears, Nose, Mouth, and Throat no gross abnormality of ear auricles or external auditory canals. normal hearing noted during  conversation. mucus membranes moist. Respiratory normal breathing without difficulty. Cardiovascular 1+ dorsalis pedis/posterior tibialis pulses. 1+ pitting edema of the bilateral lower extremities. Musculoskeletal normal gait and posture. no significant deformity or arthritic changes, no loss or range of motion, no clubbing. Psychiatric this patient is able to make decisions and demonstrates good insight into disease process. Alert and Oriented x 3. pleasant and cooperative. General Notes: Upon inspection patient's wound bed actually showed signs really being more cellulitis of left lower extremity versus anything else right leg looks good at this point. 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 Nonetheless I think the Bactrim is a very good option for [place] to start with at this point and she is in agreement with that plan. She does work as a Buyer, retail. Integumentary (Hair, Skin) Wound #1 status is Open. Original cause of wound was Gradually Appeared. The date acquired was: 06/18/2019. The wound is located on the Left,Circumferential Lower Leg. The wound measures 24.5cm length x 33cm width x 0.1cm depth; 634.994cm^2 area and 63.499cm^3 volume. There is Fat Layer (Subcutaneous Tissue) exposed. There is no tunneling or undermining noted. There is a large amount of serous drainage noted. There is medium (34-66%) red, pink granulation within the wound bed. Assessment Active Problems ICD-10 Cellulitis of left lower limb Chronic venous hypertension (idiopathic) with ulcer and inflammation of left lower  extremity Lymphedema, not elsewhere classified Non-pressure chronic ulcer of other part of left lower leg with fat layer exposed Essential (primary) hypertension Procedures Wound #1 Pre-procedure diagnosis of Wound #1 is a Cellulitis located on the Left,Circumferential Lower Leg . There was a Chemical/Enzymatic/Mechanical debridement performed by [name] Derry, PA-C. With the following instrument(s): saline gauze. Other agent used was gauze. A time out was conducted at 12:20, prior to the start of the procedure. There was no bleeding. The procedure was tolerated well with a pain level of 0 throughout and a pain level of 0 following the procedure. Post Debridement Measurements: 24.5cm length x 33cm width x 0.1cm depth; 63.499cm^3 volume. Character of Wound/Ulcer Post Debridement is stable. Post procedure Diagnosis Wound #1: Same as Pre-Procedure JOHANNY, SEGERS ([id number]) [id number] Page 7 of 9 Plan Follow-up Appointments: Return Appointment in 1 week. Bathing/ Shower/ Hygiene: May shower; gently cleanse wound  with antibacterial soap, rinse and pat dry prior to dressing wounds Edema Control - Lymphedema / Segmental Compressive Device / Other: Tubigrip single layer applied. - E The following medication(s) was prescribed: Bactrim DS oral 800 mg-160 mg tablet 1 1 tablet oral twice a day x 14 days starting 07/08/2022 WOUND #1: - Lower Leg Wound Laterality: Left, Circumferential Cleanser: Soap and Water 1 x Per Day/30 Days Discharge Instructions: Gently cleanse wound with antibacterial soap, rinse and pat dry prior to dressing wounds Prim Dressing: [employer], 6x9 (in/in) (DME) (Generic) 1 x Per Day/30 Days ary Secondary Dressing: Kerlix 4.5 x 4.1 (in/yd) (DME) (Generic) 1 x Per Day/30 Days Discharge Instructions: Apply Kerlix 4.5 x 4.1 (in/yd) as instructed Secured With: Medipore T - 9M Medipore H Soft Cloth Surgical T ape ape, 2x2  (in/yd) (DME) (Generic) 1 x Per Day/30 Days Secured With: Tubigrip Size E, 3.5x10 (in/yds) 1 x Per Day/30 Days Discharge Instructions: Apply 3 Tubigrip E 3-finger-widths below knee to base of toes to secure dressing and/or for swelling. 1. I am good recommend based on what we are seeing that we go ahead and have the patient continue to monitor for any signs of infection or worsening. Based on what I am seeing I do believe that we should go ahead and treat the current infection I would recommend sending a prescription for Bactrim for her. I think this is going to be the best way to go. 2. Also can recommend that we have the patient continue with the ABD pads to cover to catch the drainage right now and then subsequently she is going to be changing this frequently to try to keep this clean and dry is much as possible. 3. I am also can recommend that the patient should continue to elevate her legs is much as possible to help with edema control over the weekend with using Tubigrip size E but at the same time immediate Compression wrap as soon as we get this infection under control. We will see patient back for reevaluation in 1 week here in the clinic. If anything worsens or changes patient will contact our office for additional recommendations. Electronic Signature(s) Signed: 07/08/2022 12:44:44 PM By: [name] Derry PA-C Previous Signature: 07/08/2022 12:26:26 PM Version By: [name] Derry PA-C Entered By: [name] [name] on 07/08/2022 12:44:43 -------------------------------------------------------------------------------- ROS/PFSH Details Patient Name: Date of Service: [name] Dell, [place] RY 07/08/2022 11:15 A M Medical Record Number: [id number] Patient Account Number: [id number] Date of Birth/Sex: Treating RN: December 02, 1960 (61 y.o. [name] [name] Primary Care Provider: Leonia [name], [name] [name] Other Clinician: Referring Provider: Treating Provider/Extender: Babs Bertin Weeks in Treatment:  0 Eyes Complaints and Symptoms: Positive for: Glasses / Contacts Negative for: Dry Eyes; Vision Changes Gastrointestinal Complaints and Symptoms: Negative for: Frequent diarrhea; Nausea; Vomiting LIENHARD, [name] [name] ([id number]) [id number] Page 8 of 9 Endocrine Complaints and Symptoms: Negative for: Hepatitis; Thyroid disease; Polydypsia (Excessive Thirst) Medical History: Negative for: Type I Diabetes; Type II Diabetes Genitourinary Complaints and Symptoms: Negative for: Kidney failure/ Dialysis; Incontinence/dribbling Immunological Complaints and Symptoms: Negative for: Hives; Itching Integumentary (Skin) Complaints and Symptoms: Negative for: Wounds; Bleeding or bruising tendency; Breakdown; Swelling Musculoskeletal Complaints and Symptoms: Negative for: Muscle Pain; Muscle Weakness Neurologic Complaints and Symptoms: Negative for: Numbness/parasthesias; Focal/Weakness Psychiatric Complaints and Symptoms: Negative for: Anxiety; Claustrophobia Cardiovascular Medical History: Positive for: Hypertension Oncologic Immunizations Pneumococcal Vaccine: Received Pneumococcal Vaccination: No Implantable Devices None Family and Social History Current every day smoker - 1 ppd; Marital Status -  Married; Alcohol Use: Never; Drug Use: No History; Caffeine Use: Moderate Electronic Signature(s) Signed: 07/08/2022 1:44:40 PM By: [name] Derry PA-C Signed: 07/11/2022 5:22:58 PM By: [name] Aver MSN RN CNS WTA Entered By: [name] [name] on 07/08/2022 12:02:37 SuperBill Details -------------------------------------------------------------------------------- [name] ([id number]) [id number] Page 9 of 9 Patient Name: Date of Service: [name] [name], [place] 07/08/2022 Medical Record Number: [id number] Patient Account Number: [id number] Date of Birth/Sex: Treating RN: Apr 11, 1960 (62 y.o. [name] [name] Primary Care Provider: Leonia [name], [name] [name]  Other Clinician: Referring Provider: Treating Provider/Extender: Babs Bertin Weeks in Treatment: 0 Diagnosis Coding ICD-10 Codes Code Description L03.116 Cellulitis of left lower limb I87.332 Chronic venous hypertension (idiopathic) with ulcer and inflammation of left lower extremity I89.0 Lymphedema, not elsewhere classified L97.822 Non-pressure chronic ulcer of other part of left lower leg with fat layer exposed I10 Essential (primary) hypertension Facility Procedures CPT4 Code Description Modifier Quantity [id number] - WOUND CARE VISIT-LEV 3 EST PT 1 Physician Procedures Quantity CPT4 Code Description Modifier [id number] - WC PHYS LEVEL 4 - NEW PT 1 ICD-10 Diagnosis Description L03.116 Cellulitis of left lower limb I87.332 Chronic venous hypertension (idiopathic) with ulcer and inflammation of left lower extremity I89.0 Lymphedema, not elsewhere classified L97.822 Non-pressure chronic ulcer of other part of left lower leg with fat layer exposed Electronic Signature(s) Signed: 07/11/2022 4:04:20 PM By: [name] Aver MSN RN CNS WTA Signed: 07/11/2022 5:56:33 PM By: [name] Derry PA-C Previous Signature: 07/08/2022 1:44:40 PM Version By: [name] Derry PA-C Previous Signature: 07/08/2022 12:26:46 PM Version By: [name] Derry PA-C Entered By: [name] [name] on 07/11/2022 16:04:20

## 2022-07-14 ENCOUNTER — Encounter: Payer: Commercial Managed Care - PPO | Admitting: Physician Assistant

## 2022-07-14 DIAGNOSIS — I87332 Chronic venous hypertension (idiopathic) with ulcer and inflammation of left lower extremity: Secondary | ICD-10-CM | POA: Diagnosis not present

## 2022-07-14 NOTE — Progress Notes (Addendum)
AMELIAROSE, TARANTINO ([id number]) [id number] Page 1 of 6 Visit Report for 07/14/2022 Chief Complaint Document Details Patient Name: Date of Service: [name] [name], [place] 07/14/2022 7:45 A M Medical Record Number: [id number] Patient Account Number: [id number] Date of Birth/Sex: Treating RN: 1960/02/05 (62 y.o. Skip Mayer Primary Care Provider: Leonia Reader, Barbara Cower Other Clinician: Referring Provider: Treating Provider/Extender: Thornell Mule in Treatment: 0 Information Obtained from: Patient Chief Complaint Left LE Cellulitis Electronic Signature(s) Signed: 07/14/2022 8:13:35 AM By: Allen Derry PA-C Entered By: Allen Derry on 07/14/2022 08:13:35 -------------------------------------------------------------------------------- HPI Details Patient Name: Date of Service: [name] Dell, [place] RY 07/14/2022 7:45 A M Medical Record Number: [id number] Patient Account Number: [id number] Date of Birth/Sex: Treating RN: 23-Jul-1960 (62 y.o. Skip Mayer Primary Care Provider: Leonia Reader, Barbara Cower Other Clinician: Referring Provider: Treating Provider/Extender: Thornell Mule in Treatment: 0 History of Present Illness HPI Description: 07-08-2022 upon evaluation today patient presents for initial inspection here in our clinic she was actually referred by primary care she actually appears to have more cellulitis than a true wound on the left lower extremity. This is hot and warm to touch it is draining that there is not really a good place I can obtain a wound culture from May which is showing more superficial bacteria which would not really be terribly helpful at this point. For that reason what I can do anything in that regard. The patient tells me this is an intermittent history over the past 3 years of this occurring she has not been on antibiotics for this episode. With that being said she tells me that this is hurting quite a bit she was given tramadol before  she was referred to [place]. She does have a history of chronic venous insufficiency for sure she also has what appears to be lymphedema based on what I am seeing. Other than that she does have hypertension no other major medical problems. 07-14-2022 upon evaluation patient unfortunately is having issues with what appears to be cellulitis at this time. I am not pleased with the progress that she is made since last week I put her on Bactrim and I do not see a significant improvement she is not draining quite as much but she also has been off work for couple days she tells me when she works this tends to drain even more. Fortunately I do not see any signs of systemic infection at the moment although she tells me she in general just does not feel well. Electronic Signature(s) Barrington, [name] ([id number]) [id number] Page 2 of 6 Signed: 07/14/2022 9:17:52 AM By: Allen Derry PA-C Entered By: Allen Derry on 07/14/2022 09:17:51 -------------------------------------------------------------------------------- Physical Exam Details Patient Name: Date of Service: [name] [name], [place] 07/14/2022 7:45 A M Medical Record Number: [id number] Patient Account Number: [id number] Date of Birth/Sex: Treating RN: January 27, 1960 (62 y.o. Skip Mayer Primary Care Provider: Leonia Reader, Barbara Cower Other Clinician: Referring Provider: Treating Provider/Extender: Babs Bertin Weeks in Treatment: 0 Constitutional Well-nourished and well-hydrated in no acute distress. Respiratory normal breathing without difficulty. Psychiatric this patient is able to make decisions and demonstrates good insight into disease process. Alert and Oriented x 3. pleasant and cooperative. Notes Patient does not really have any wounds per se it is more of an area of cellulitis in general unfortunately in overall I think that she is at this point likely best benefited by going to the ER for further evaluation and treatment.  This can get much worse  very quickly. Electronic Signature(s) Signed: 07/14/2022 9:18:50 AM By: Allen Derry PA-C Entered By: Allen Derry on 07/14/2022 09:18:50 -------------------------------------------------------------------------------- Physician Orders Details Patient Name: Date of Service: [name] [name], [name] 07/14/2022 7:45 A M Medical Record Number: [id number] Patient Account Number: [id number] Date of Birth/Sex: Treating RN: 05-Dec-1960 (62 y.o. Skip Mayer Primary Care Provider: Leonia Reader, Barbara Cower Other Clinician: Referring Provider: Treating Provider/Extender: Thornell Mule in Treatment: 0 Verbal / Phone Orders: No Diagnosis Coding ICD-10 Coding Code Description L03.116 Cellulitis of left lower limb I87.332 Chronic venous hypertension (idiopathic) with ulcer and inflammation of left lower extremity I89.0 Lymphedema, not elsewhere classified [name] [name], [name] [name] ([id number]) [id number] Page 3 of 6 [phone] Non-pressure chronic ulcer of other part of left lower leg with fat layer exposed I10 Essential (primary) hypertension Follow-up Appointments Return Appointment in 1 week. Bathing/ Shower/ Hygiene May shower; gently cleanse wound with antibacterial soap, rinse and pat dry prior to dressing wounds Edema Control - Lymphedema / Segmental Compressive Device / Other Tubigrip single layer applied. Additional Orders / Instructions Go to Emergency Department of your choice for evaluation and treatment. - PA encouraged patient to go to ER for IV antibiotics, patient declined at this time. Wound Treatment Wound #1 - Lower Leg Wound Laterality: Left, Circumferential Cleanser: Soap and Water 1 x Per Day/30 Days Discharge Instructions: Gently cleanse wound with antibacterial soap, rinse and pat dry prior to dressing wounds Secondary Dressing: ABD Pad 5x9 (in/in) 1 x Per Day/30 Days Discharge Instructions: Cover with ABD pad Secondary Dressing:  Kerlix 4.5 x 4.1 (in/yd) (Generic) 1 x Per Day/30 Days Discharge Instructions: Apply Kerlix 4.5 x 4.1 (in/yd) as instructed Secured With: Medipore T - 18M Medipore H Soft Cloth Surgical T ape ape, 2x2 (in/yd) (Generic) 1 x Per Day/30 Days Secured With: Tubigrip Size E, 3.5x10 (in/yds) 1 x Per Day/30 Days Discharge Instructions: Apply 3 Tubigrip E 3-finger-widths below knee to base of toes to secure dressing and/or for swelling. Consults Infectious Disease Electronic Signature(s) Signed: 07/14/2022 4:37:47 PM By: Elliot Gurney, BSN, RN, CWS, Kim RN, BSN Signed: 07/15/2022 1:40:48 PM By: Allen Derry PA-C Entered By: Elliot Gurney BSN, RN, CWS, Kim on 07/14/2022 08:24:55 -------------------------------------------------------------------------------- Problem List Details Patient Name: Date of Service: [name] [name], [name] 07/14/2022 7:45 A M Medical Record Number: [id number] Patient Account Number: [id number] Date of Birth/Sex: Treating RN: Jun 18, 1960 (61 y.o. Skip Mayer Primary Care Provider: Leonia Reader, Barbara Cower Other Clinician: Referring Provider: Treating Provider/Extender: Babs Bertin Weeks in Treatment: 0 Active Problems ICD-10 Encounter Code Description Active Date MDM Diagnosis L03.116 Cellulitis of left lower limb 07/08/2022 No Yes I87.332 Chronic venous hypertension (idiopathic) with ulcer and inflammation of left 07/08/2022 No Yes JULLIANA, BENISH ([id number]) [id number] Page 4 of 6 lower extremity I89.0 Lymphedema, not elsewhere classified 07/08/2022 No Yes L97.822 Non-pressure chronic ulcer of other part of left lower leg with fat layer exposed6/21/2024 No Yes I10 Essential (primary) hypertension 07/08/2022 No Yes Inactive Problems Resolved Problems Electronic Signature(s) Signed: 07/14/2022 8:13:33 AM By: Allen Derry PA-C Entered By: Allen Derry on 07/14/2022 08:13:33 -------------------------------------------------------------------------------- Progress  Note Details Patient Name: Date of Service: [name] [name], [name] 07/14/2022 7:45 A M Medical Record Number: [id number] Patient Account Number: [id number] Date of Birth/Sex: Treating RN: [birth year] (61 y.o. Skip Mayer Primary Care Provider: Leonia Reader, Barbara Cower Other Clinician: Referring Provider: Treating Provider/Extender: Thornell Mule in Treatment: 0 Subjective Chief Complaint Information obtained from Patient Left LE Cellulitis History of Present Illness (HPI)  07-08-2022 upon evaluation today patient presents for initial inspection here in our clinic she was actually referred by primary care she actually appears to have more cellulitis than a true wound on the left lower extremity. This is hot and warm to touch it is draining that there is not really a good place I can obtain a wound culture from May which is showing more superficial bacteria which would not really be terribly helpful at this point. For that reason what I can do anything in that regard. The patient tells me this is an intermittent history over the past 3 years of this occurring she has not been on antibiotics for this episode. With that being said she tells me that this is hurting quite a bit she was given tramadol before she was referred to [place]. She does have a history of chronic venous insufficiency for sure she also has what appears to be lymphedema based on what I am seeing. Other than that she does have hypertension no other major medical problems. 07-14-2022 upon evaluation patient unfortunately is having issues with what appears to be cellulitis at this time. I am not pleased with the progress that she is made since last week I put her on Bactrim and I do not see a significant improvement she is not draining quite as much but she also has been off work for couple days she tells me when she works this tends to drain even more. Fortunately I do not see any signs of systemic infection at the moment although  she tells me she in general just does not feel well. [name] [name], [name] [name] ([id number]) [id number] Page 5 of 6 Constitutional Well-nourished and well-hydrated in no acute distress. Vitals Time Taken: 7:59 AM, Height: 69 in, Weight: 254 lbs, BMI: 37.5, Temperature: 98.3 F, Pulse: 84 bpm, Respiratory Rate: 18 breaths/min, Blood Pressure: 124/72 mmHg. Respiratory normal breathing without difficulty. Psychiatric this patient is able to make decisions and demonstrates good insight into disease process. Alert and Oriented x 3. pleasant and cooperative. General Notes: Patient does not really have any wounds per se it is more of an area of cellulitis in general unfortunately in overall I think that she is at this point likely best benefited by going to the ER for further evaluation and treatment. This can get much worse very quickly. Integumentary (Hair, Skin) Wound #1 status is Open. Original cause of wound was Gradually Appeared. The date acquired was: 06/18/2019. The wound is located on the Left,Circumferential Lower Leg. The wound measures 0.3cm length x 0.5cm width x 0.2cm depth; 0.118cm^2 area and 0.024cm^3 volume. There is Fat Layer (Subcutaneous Tissue) exposed. There is no tunneling or undermining noted. There is a large amount of serous drainage noted. The wound margin is flat and intact. There is no granulation within the wound bed. There is a large (67-100%) amount of necrotic tissue within the wound bed including Adherent Slough. 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 Bathing/ Shower/ Hygiene: May shower; gently cleanse wound with  antibacterial soap, rinse and pat dry prior to dressing wounds Edema Control - Lymphedema / Segmental Compressive Device / Other: Tubigrip single layer applied. Additional Orders / Instructions: Go to Emergency Department of your choice for evaluation and treatment. - PA encouraged  patient to go to ER for IV antibiotics, patient declined at this time. Consults ordered were: Infectious Disease WOUND #1: - Lower Leg Wound Laterality: Left, Circumferential Cleanser: Soap and Water 1 x Per Day/30 Days Discharge Instructions: Gently cleanse wound with antibacterial soap, rinse and pat dry prior to dressing wounds Secondary Dressing: ABD Pad 5x9 (in/in) 1 x Per Day/30 Days Discharge Instructions: Cover with ABD pad Secondary Dressing: Kerlix 4.5 x 4.1 (in/yd) (Generic) 1 x Per Day/30 Days Discharge Instructions: Apply Kerlix 4.5 x 4.1 (in/yd) as instructed Secured With: Medipore T - 18M Medipore H Soft Cloth Surgical T ape ape, 2x2 (in/yd) (Generic) 1 x Per Day/30 Days Secured With: Tubigrip Size E, 3.5x10 (in/yds) 1 x Per Day/30 Days Discharge Instructions: Apply 3 Tubigrip E 3-finger-widths below knee to base of toes to secure dressing and/or for swelling. 1. Based on what we are seeing at this point I do believe that the patient should go ahead and continue to monitor for any signs of infection or worsening. Based on what I am seeing I do believe that if she gets any worse she needs to go to the ER in fact that was my recommendation for today although she was unable to do so because of work she tells me. 2. We could not change any other antibiotics as most of them would have actually cause interaction with her current medication list. For that reason she is going to continue with the Bactrim. 3. I am also going to make referral to infectious disease to see what they could recommend for her to try to help improve the overall standing in regard to her infection. We will see patient back for  reevaluation in 1 week here in the clinic. If anything worsens or changes patient will contact our office for additional recommendations. In regard to the patient going to the ER she was pretty adamant about not being able to do so due to work. For that reason I did advise that she needs to be very cautious and if she notices any signs of worsening in general she needs to get to the ER as soon as possible. She voiced understanding. Electronic Signature(s) Signed: 07/14/2022 9:20:38 AM By: Allen Derry PA-C Entered By: Allen Derry on 07/14/2022 09:20:38 [name] [name] ([id number]) [id number] Page 6 of 6 -------------------------------------------------------------------------------- SuperBill Details Patient Name: Date of Service: [name] [name], [place] 07/14/2022 Medical Record Number: [id number] Patient Account Number: [id number] Date of Birth/Sex: Treating RN: 12-15-1960 (62 y.o. Cathlean Cower, Kim Primary Care Provider: Leonia Reader, Barbara Cower Other Clinician: Referring Provider: Treating Provider/Extender: Babs Bertin Weeks in Treatment: 0 Diagnosis Coding ICD-10 Codes Code Description L03.116 Cellulitis of left lower limb I87.332 Chronic venous hypertension (idiopathic) with ulcer and inflammation of left lower extremity I89.0 Lymphedema, not elsewhere classified L97.822 Non-pressure chronic ulcer of other part of left lower leg with fat layer exposed I10 Essential (primary) hypertension Facility Procedures : CPT4 Code: [id number] Description: 99213 - WOUND CARE VISIT-LEV 3 EST PT Modifier: Quantity: 1 Physician Procedures : CPT4 Code Description Modifier [id number] 99214 - WC PHYS LEVEL 4 - EST PT ICD-10 Diagnosis Description L03.116 Cellulitis of left lower limb I87.332 Chronic venous hypertension (idiopathic) with ulcer and inflammation of left lower extremity I89.0  Lymphedema, not elsewhere classified L97.822 Non-pressure chronic ulcer of other part of left  lower leg with fat layer exposed Quantity: 1 Electronic Signature(s) Signed: 07/14/2022 9:21:39 AM By: Allen Derry PA-C Entered By: Allen Derry on 07/14/2022 09:21:39

## 2022-07-14 NOTE — Progress Notes (Signed)
[name] [name] ([id number]) [id number] Page 1 of 10 Visit Report for 07/14/2022 Arrival Information Details Patient Name: Date of Service: [name] [name] [name] [name] 07/14/2022 7:45 A M Medical Record Number: [id number] Patient Account Number: [id number] Date of Birth/Sex: Treating RN: [birth year] (62 y.o. [name] [name] Primary Care Mikaia Janvier: Leonia Reader, Barbara Cower Other Clinician: Referring Racheal Mathurin: Treating Briawna Carver/Extender: Thornell Mule in Treatment: 0 Visit Information History Since Last Visit Added or deleted any medications: No Patient Arrived: Ambulatory Has Dressing in Place as Prescribed: Yes Arrival Time: 07:55 Pain Present Now: Yes Accompanied By: self Transfer Assistance: None Patient Identification Verified: Yes Secondary Verification Process Completed: Yes Patient Requires Transmission-Based Precautions: No Patient Has Alerts: Yes Patient Alerts: Not Diabetic Electronic Signature(s) Signed: 07/14/2022 4:37:47 PM By: Elliot Gurney, BSN, RN, CWS, Kim RN, BSN Entered By: Elliot Gurney, BSN, RN, CWS, Kim on 07/14/2022 07:59:17 -------------------------------------------------------------------------------- Clinic Level of Care Assessment Details Patient Name: Date of Service: [name] [name], [name] [name] 07/14/2022 7:45 A M Medical Record Number: [id number] Patient Account Number: [id number] Date of Birth/Sex: Treating RN: [birth year] (62 y.o. [name] [name] Primary Care Larita Deremer: Leonia Reader, Barbara Cower Other Clinician: Referring Mykaila Blunck: Treating Ixchel Duck/Extender: Thornell Mule in Treatment: 0 Clinic Level of Care Assessment Items TOOL 4 Quantity Score []  - 0 Use when only an EandM is performed on FOLLOW-UP visit ASSESSMENTS - Nursing Assessment / Reassessment X- 1 10 Reassessment of Co-morbidities (includes updates in patient status) X- 1 5 Reassessment of Adherence to Treatment Plan ASSESSMENTS - Wound and Skin A ssessment / Reassessment X - Simple  Wound Assessment / Reassessment - one wound 1 5 [name] [name] ([id number]) [id number] Page 2 of 10 []  - 0 Complex Wound Assessment / Reassessment - multiple wounds []  - 0 Dermatologic / Skin Assessment (not related to wound area) ASSESSMENTS - Focused Assessment []  - 0 Circumferential Edema Measurements - multi extremities []  - 0 Nutritional Assessment / Counseling / Intervention []  - 0 Lower Extremity Assessment (monofilament, tuning fork, pulses) []  - 0 Peripheral Arterial Disease Assessment (using hand held doppler) ASSESSMENTS - Ostomy and/or Continence Assessment and Care []  - 0 Incontinence Assessment and Management []  - 0 Ostomy Care Assessment and Management (repouching, etc.) PROCESS - Coordination of Care X - Simple Patient / Family Education for ongoing care 1 15 []  - 0 Complex (extensive) Patient / Family Education for ongoing care X- 1 10 Staff obtains Chiropractor, Records, T Results / Process Orders est []  - 0 Staff telephones HHA, Nursing Homes / Clarify orders / etc X- 1 10 Routine Transfer to another Facility (non-emergent condition) []  - 0 Routine Hospital Admission (non-emergent condition) []  - 0 New Admissions / Manufacturing engineer / Ordering NPWT Apligraf, etc. , []  - 0 Emergency Hospital Admission (emergent condition) X- 1 10 Simple Discharge Coordination []  - 0 Complex (extensive) Discharge Coordination PROCESS - Special Needs []  - 0 Pediatric / Minor Patient Management []  - 0 Isolation Patient Management []  - 0 Hearing / Language / Visual special needs []  - 0 Assessment of Community assistance (transportation, D/C planning, etc.) []  - 0 Additional assistance / Altered mentation []  - 0 Support Surface(s) Assessment (bed, cushion, seat, etc.) INTERVENTIONS - Wound Cleansing / Measurement X - Simple Wound Cleansing - one wound 1 5 []  - 0 Complex Wound Cleansing - multiple wounds X- 1 5 Wound Imaging  (photographs - any number of wounds) []  - 0 Wound Tracing (instead of photographs) X- 1 5 Simple Wound Measurement - one wound []  -  0 Complex Wound Measurement - multiple wounds INTERVENTIONS - Wound Dressings []  - 0 Small Wound Dressing one or multiple wounds X- 1 15 Medium Wound Dressing one or multiple wounds []  - 0 Large Wound Dressing one or multiple wounds []  - 0 Application of Medications - topical []  - 0 Application of Medications - injection INTERVENTIONS - Miscellaneous []  - 0 External ear exam []  - 0 Specimen Collection (cultures, biopsies, blood, body fluids, etc.) []  - 0 Specimen(s) / Culture(s) sent or taken to Lab for analysis [place], [name] [name] ([id number]) [id number] Page 3 of 10 []  - 0 Patient Transfer (multiple staff / Nurse, adult / Similar devices) []  - 0 Simple Staple / Suture removal (25 or less) []  - 0 Complex Staple / Suture removal (26 or more) []  - 0 Hypo / Hyperglycemic Management (close monitor of Blood Glucose) []  - 0 Ankle / Brachial Index (ABI) - do not check if billed separately X- 1 5 Vital Signs Has the patient been seen at the hospital within the last three years: Yes Total Score: 100 Level Of Care: New/Established - Level 3 Electronic Signature(s) Signed: 07/14/2022 4:37:47 PM By: Elliot Gurney, BSN, RN, CWS, Kim RN, BSN Entered By: Elliot Gurney, BSN, RN, CWS, Kim on 07/14/2022 08:23:13 -------------------------------------------------------------------------------- Encounter Discharge Information Details Patient Name: Date of Service: [name] [name], [name] [name] 07/14/2022 7:45 A M Medical Record Number: [id number] Patient Account Number: [id number] Date of Birth/Sex: Treating RN: October 17, 1960 (62 y.o. [name] [name] Primary Care Koben Daman: Leonia Reader, Barbara Cower Other Clinician: Referring Ashani Pumphrey: Treating Lashaye Fisk/Extender: Thornell Mule in Treatment: 0 Encounter Discharge Information Items Discharge Condition:  Stable Ambulatory Status: Ambulatory Discharge Destination: Home Transportation: Private Auto Accompanied By: self Schedule Follow-up Appointment: Yes Clinical Summary of Care: Electronic Signature(s) Signed: 07/14/2022 4:37:47 PM By: Elliot Gurney, BSN, RN, CWS, Kim RN, BSN Entered By: Elliot Gurney, BSN, RN, CWS, Kim on 07/14/2022 34:74:25 -------------------------------------------------------------------------------- Lower Extremity Assessment Details Patient Name: Date of Service: [name] [name], [name] [name] 07/14/2022 7:45 A M Medical Record Number: [id number] Patient Account Number: [id number] Date of Birth/Sex: Treating RN: Feb 07, 1960 (61 y.o. [name] [name] Primary Care Jailen Coward: Crist Fat Other Clinician: [name] ([id number]) [id number] Page 4 of 10 Referring Takelia Urieta: Treating Danali Marinos/Extender: Thornell Mule in Treatment: 0 Edema Assessment Assessed: [Left: No] [Right: No] [Left: Edema] [Right: :] Calf Left: Right: Point of Measurement: 34 cm From Medial Instep 52 cm Ankle Left: Right: Point of Measurement: 12 cm From Medial Instep 33 cm Vascular Assessment Pulses: Dorsalis Pedis Palpable: [Left:No Yes] Notes Pulses not palpable due swelling. Lower leg is hot to touch. Electronic Signature(s) Signed: 07/14/2022 4:37:47 PM By: Elliot Gurney, BSN, RN, CWS, Kim RN, BSN Entered By: Elliot Gurney, BSN, RN, CWS, Kim on 07/14/2022 16:60:63 -------------------------------------------------------------------------------- Multi Wound Chart Details Patient Name: Date of Service: [name] [name], [name] [name] 07/14/2022 7:45 A M Medical Record Number: [id number] Patient Account Number: [id number] Date of Birth/Sex: Treating RN: February 20, 1960 (61 y.o. [name] [name] Primary Care Camira Geidel: Leonia Reader, Barbara Cower Other Clinician: Referring Caeli Linehan: Treating Reshonda Koerber/Extender: Babs Bertin Weeks in Treatment: 0 Vital Signs Height(in): 69 Pulse(bpm): 84 Weight(lbs):  254 Blood Pressure(mmHg): 124/72 Body Mass Index(BMI): 37.5 Temperature(F): 98.3 Respiratory Rate(breaths/min): 18 [1:Photos:] [N/A:N/A [id number] Page 5 of 10] Left, Circumferential Lower Leg N/A N/A Wound Location: Gradually Appeared N/A N/A Wounding Event: Cellulitis N/A N/A Primary Etiology: Hypertension N/A N/A Comorbid History: 06/18/2019 N/A N/A Date Acquired: 0 N/A N/A Weeks of Treatment: Open N/A N/A Wound Status: No N/A N/A Wound  Recurrence: Yes N/A N/A Clustered Wound: 0.3x0.5x0.2 N/A N/A Measurements L x W x D (cm) 0.118 N/A N/A A (cm) : rea 0.024 N/A N/A Volume (cm) : 100.00% N/A N/A % Reduction in A rea: 100.00% N/A N/A % Reduction in Volume: Full Thickness Without Exposed N/A N/A Classification: Support Structures Large N/A N/A Exudate Amount: Serous N/A N/A Exudate Type: amber N/A N/A Exudate Color: Flat and Intact N/A N/A Wound Margin: None Present (0%) N/A N/A Granulation Amount: Large (67-100%) N/A N/A Necrotic Amount: Fat Layer (Subcutaneous Tissue): Yes N/A N/A Exposed Structures: Fascia: No Tendon: No Muscle: No Joint: No Bone: No Large (67-100%) N/A N/A Epithelialization: Treatment Notes Electronic Signature(s) Signed: 07/14/2022 4:37:47 PM By: Elliot Gurney, BSN, RN, CWS, Kim RN, BSN Entered By: Elliot Gurney, BSN, RN, CWS, Kim on 07/14/2022 08:21:02 -------------------------------------------------------------------------------- Multi-Disciplinary Care Plan Details Patient Name: Date of Service: [name] [name], [name] [name] 07/14/2022 7:45 A M Medical Record Number: [id number] Patient Account Number: [id number] Date of Birth/Sex: Treating RN: [birth year] (61 y.o. [name] [name] Primary Care Azul Brumett: Leonia Reader, Barbara Cower Other Clinician: Referring Bellina Tokarczyk: Treating Roshell Brigham/Extender: Thornell Mule in Treatment: 0 [place], [place] ([id number]) [id number] Page 6 of 10 Active  Inactive Orientation to the Wound Care Program Nursing Diagnoses: Knowledge deficit related to the wound healing center program Goals: Patient/caregiver will verbalize understanding of the Wound Healing Center Program Date Initiated: 07/08/2022 Target Resolution Date: 07/15/2022 Goal Status: Active Interventions: Provide education on orientation to the wound center Notes: Soft Tissue Infection Nursing Diagnoses: Impaired tissue integrity Potential for infection: soft tissue Goals: Patient/caregiver will verbalize understanding of or measures to prevent infection and contamination in the home setting Date Initiated: 07/08/2022 Target Resolution Date: 08/07/2022 Goal Status: Active Patient's soft tissue infection will resolve Date Initiated: 07/08/2022 Target Resolution Date: 08/07/2022 Goal Status: Active Signs and symptoms of infection will be recognized early to allow for prompt treatment Date Initiated: 07/08/2022 Target Resolution Date: 08/07/2022 Goal Status: Active Interventions: Assess signs and symptoms of infection every visit Provide education on infection Treatment Activities: Systemic antibiotics : 07/08/2022 Notes: Wound/Skin Impairment Nursing Diagnoses: Impaired tissue integrity Knowledge deficit related to smoking impact on wound healing Knowledge deficit related to ulceration/compromised skin integrity Goals: Patient will demonstrate a reduced rate of smoking or cessation of smoking Date Initiated: 07/08/2022 Target Resolution Date: 08/07/2022 Goal Status: Active Patient/caregiver will verbalize understanding of skin care regimen Date Initiated: 07/08/2022 Target Resolution Date: 08/07/2022 Goal Status: Active Ulcer/skin breakdown will have a volume reduction of 30% by week 4 Date Initiated: 07/08/2022 Target Resolution Date: 08/07/2022 Goal Status: Active Ulcer/skin breakdown will have a volume reduction of 50% by week 8 Date Initiated: 07/08/2022 Target  Resolution Date: 09/07/2022 Goal Status: Active Interventions: Assess patient/caregiver ability to obtain necessary supplies Assess patient/caregiver ability to perform ulcer/skin care regimen upon admission and as needed Assess ulceration(s) every visit Provide education on smoking Provide education on ulcer and skin care Treatment Activities: Referred to DME [facility] for dressing supplies : 07/08/2022 Skin care regimen initiated : 07/08/2022 Notes: BREELLE, [place] ([id number]) [id number] Page 7 of 10 Electronic Signature(s) Signed: 07/14/2022 4:37:47 PM By: Elliot Gurney, BSN, RN, CWS, Kim RN, BSN Entered By: Elliot Gurney, BSN, RN, CWS, Kim on 07/14/2022 08:25:09 -------------------------------------------------------------------------------- Pain Assessment Details Patient Name: Date of Service: [name] [name], [name] [name] 07/14/2022 7:45 A M Medical Record Number: [id number] Patient Account Number: [id number] Date of Birth/Sex: Treating RN: 05/21/1960 (61 y.o. [name] [name] Primary Care Amous Crewe: Leonia Reader, Barbara Cower Other Clinician: Referring Eiley Mcginnity: Treating Amyjo Mizrachi/Extender: Allen Derry  Leonia Reader, Barbara Cower Weeks in Treatment: 0 Active Problems Location of Pain Severity and Description of Pain Patient Has Paino Yes Site Locations Pain Location: Pain in Ulcers Rate the pain. Current Pain Level: 2 Character of Pain Describe the Pain: Other: irritating Pain Management and Medication Current Pain Management: Electronic Signature(s) Signed: 07/14/2022 4:37:47 PM By: Elliot Gurney, BSN, RN, CWS, Kim RN, BSN Entered By: Elliot Gurney, BSN, RN, CWS, Kim on 07/14/2022 08:00:54 [name] ([id number]) [id number] Page 8 of 10 -------------------------------------------------------------------------------- Patient/Caregiver Education Details Patient Name: Date of Service: [name] [name], [name] [place] 6/27/2024andnbsp7:45 A M Medical Record Number: [id number] Patient Account Number:  [id number] Date of Birth/Gender: Treating RN: 1960-03-14 (62 y.o. [name] [name] Primary Care Physician: Leonia Reader, Barbara Cower Other Clinician: Referring Physician: Treating Physician/Extender: Thornell Mule in Treatment: 0 Education Assessment Education Provided To: Patient Education Topics Provided Wound/Skin Impairment: Handouts: Caring for Your Ulcer Methods: Demonstration Responses: State content correctly Electronic Signature(s) Signed: 07/14/2022 4:37:47 PM By: Elliot Gurney, BSN, RN, CWS, Kim RN, BSN Entered By: Elliot Gurney, BSN, RN, CWS, Kim on 07/14/2022 23:55:73 -------------------------------------------------------------------------------- Wound Assessment Details Patient Name: Date of Service: [name] [name], [name] [name] 07/14/2022 7:45 A M Medical Record Number: [id number] Patient Account Number: [id number] Date of Birth/Sex: Treating RN: 05-17-1960 (61 y.o. [name] [name] Primary Care Jovee Dettinger: Leonia Reader, Barbara Cower Other Clinician: Referring Mayzee Reichenbach: Treating Dawnita Molner/Extender: Babs Bertin Weeks in Treatment: 0 Wound Status Wound Number: 1 Primary Etiology: Cellulitis Wound Location: Left, Circumferential Lower Leg Wound Status: Open Wounding Event: Gradually Appeared Comorbid History: Hypertension Date Acquired: 06/18/2019 Weeks Of Treatment: 0 Clustered Wound: Yes Photos [place], [name] [name] ([id number]) [id number] Page 9 of 10 Wound Measurements Length: (cm) 0.3 Width: (cm) 0.5 Depth: (cm) 0.2 Area: (cm) 0.118 Volume: (cm) 0.024 % Reduction in Area: 100% % Reduction in Volume: 100% Epithelialization: Large (67-100%) Tunneling: No Undermining: No Wound Description Classification: Full Thickness Without Exposed Support Structures Wound Margin: Flat and Intact Exudate Amount: Large Exudate Type: Serous Exudate Color: amber Foul Odor After Cleansing: No Slough/Fibrino Yes Wound Bed Granulation Amount: None Present (0%) Exposed  Structure Necrotic Amount: Large (67-100%) Fascia Exposed: No Necrotic Quality: Adherent Slough Fat Layer (Subcutaneous Tissue) Exposed: Yes Tendon Exposed: No Muscle Exposed: No Joint Exposed: No Bone Exposed: No Treatment Notes Wound #1 (Lower Leg) Wound Laterality: Left, Circumferential Cleanser Soap and Water Discharge Instruction: Gently cleanse wound with antibacterial soap, rinse and pat dry prior to dressing wounds Peri-Wound Care Topical Primary Dressing Secondary Dressing ABD Pad 5x9 (in/in) Discharge Instruction: Cover with ABD pad Kerlix 4.5 x 4.1 (in/yd) Discharge Instruction: Apply Kerlix 4.5 x 4.1 (in/yd) as instructed Secured With Medipore T - 69M Medipore H Soft Cloth Surgical T ape ape, 2x2 (in/yd) Tubigrip Size E, 3.5x10 (in/yds) Discharge Instruction: Apply 3 Tubigrip E 3-finger-widths below knee to base of toes to secure dressing and/or for swelling. Compression Wrap Compression Stockings Add-Ons Electronic Signature(s) Signed: 07/14/2022 4:37:47 PM By: Elliot Gurney, BSN, RN, CWS, Kim RN, BSN Entered By: Elliot Gurney, BSN, RN, CWS, Kim on 07/14/2022 51:76:16 [name] ([id number]) [id number] Page 10 of 10 -------------------------------------------------------------------------------- Vitals Details Patient Name: Date of Service: [name] [name] [name] [name] 07/14/2022 7:45 A M Medical Record Number: [id number] Patient Account Number: [id number] Date of Birth/Sex: Treating RN: [birth year] (62 y.o. [name] [name] Primary Care Bader Stubblefield: Leonia Reader, Barbara Cower Other Clinician: Referring Shawntay Prest: Treating Moshe Wenger/Extender: Thornell Mule in Treatment: 0 Vital Signs Time Taken: 07:59 Temperature (F): 98.3 Height (in): 69 Pulse (bpm): 84 Weight (lbs): 254  Respiratory Rate (breaths/min): 18 Body Mass Index (BMI): 37.5 Blood Pressure (mmHg): 124/72 Reference Range: 80 - 120 mg / dl Electronic Signature(s) Signed: 07/14/2022 4:37:47 PM By:  Elliot Gurney, BSN, RN, CWS, Kim RN, BSN Entered By: Elliot Gurney, BSN, RN, CWS, Kim on 07/14/2022 08:00:28

## 2022-07-22 ENCOUNTER — Encounter: Payer: Commercial Managed Care - PPO | Attending: Physician Assistant | Admitting: Physician Assistant

## 2022-07-22 DIAGNOSIS — I872 Venous insufficiency (chronic) (peripheral): Secondary | ICD-10-CM | POA: Diagnosis not present

## 2022-07-22 DIAGNOSIS — L97822 Non-pressure chronic ulcer of other part of left lower leg with fat layer exposed: Secondary | ICD-10-CM | POA: Insufficient documentation

## 2022-07-22 DIAGNOSIS — I10 Essential (primary) hypertension: Secondary | ICD-10-CM | POA: Insufficient documentation

## 2022-07-22 DIAGNOSIS — L03116 Cellulitis of left lower limb: Secondary | ICD-10-CM | POA: Diagnosis not present

## 2022-07-22 DIAGNOSIS — I87332 Chronic venous hypertension (idiopathic) with ulcer and inflammation of left lower extremity: Secondary | ICD-10-CM | POA: Insufficient documentation

## 2022-07-22 DIAGNOSIS — I890 Lymphedema, not elsewhere classified: Secondary | ICD-10-CM | POA: Insufficient documentation

## 2022-07-22 NOTE — Progress Notes (Signed)
[name] [name], [name] [name] ([id number]) [id number] Page 1 of 6 Visit Report for 07/22/2022 Chief Complaint Document Details Patient Name: Date of Service: [name] [name], [place] 07/22/2022 11:00 A M Medical Record Number: [id number] Patient Account Number: [id number] Date of Birth/Sex: Treating RN: 12-19-1960 (62 y.o. [name] [name] Primary Care Provider: Leonia [name], [name] [name] Other Clinician: Referring Provider: Treating Provider/Extender: [name] [name] in Treatment: 2 Information Obtained from: Patient Chief Complaint Left LE Cellulitis Electronic Signature(s) Signed: 07/22/2022 12:56:26 PM By: [name] Derry PA-C Signed: 07/25/2022 5:27:27 PM By: [name] Aver MSN RN CNS WTA Previous Signature: 07/22/2022 11:13:30 AM Version By: [name] Derry PA-C Entered By: [name] [name] on 07/22/2022 11:32:47 -------------------------------------------------------------------------------- HPI Details Patient Name: Date of Service: [name] Dell, MA RY 07/22/2022 11:00 A M Medical Record Number: [id number] Patient Account Number: [id number] Date of Birth/Sex: Treating RN: 1960/05/22 (62 y.o. [name] [name] Primary Care Provider: Leonia [name], [name] [name] Other Clinician: Referring Provider: Treating Provider/Extender: [name] [name] in Treatment: 2 History of Present Illness HPI Description: 07-08-2022 upon evaluation today patient presents for initial inspection here in our clinic she was actually referred by primary care she actually appears to have more cellulitis than a true wound on the left lower extremity. This is hot and warm to touch it is draining that there is not really a good place I can obtain a wound culture from May which is showing more superficial bacteria which would not really be terribly helpful at this point. For that reason what I can do anything in that regard. The patient tells me this is an intermittent history over the past 3 years of this occurring she has not  been on antibiotics for this episode. With that being said she tells me that this is hurting quite a bit she was given tramadol before she was referred to [place]. She does have a history of chronic venous insufficiency for sure she also has what appears to be lymphedema based on what I am seeing. Other than that she does have hypertension no other major medical problems. 07-14-2022 upon evaluation patient unfortunately is having issues with what appears to be cellulitis at this time. I am not pleased with the progress that she is made since last week I put her on Bactrim and I do not see a significant improvement she is not draining quite as much but she also has been off work for couple days she tells me when she works this tends to drain even more. Fortunately I do not see any signs of systemic infection at the moment although she tells me she in general just does not feel well. 07-22-2022 upon evaluation today patient's leg actually looks worse. She has not heard from infectious disease as of yet based on what she is telling me she [place], [name] [name] ([id number]) [id number] Page 2 of 6 looked back and stated she did not see because that she got it from them. With that being said I think that she needs to give them a call today to try to get this scheduled as quickly as possible. Electronic Signature(s) Signed: 07/22/2022 11:33:20 AM By: [name] Derry PA-C Entered By: [name] [name] on 07/22/2022 11:33:20 -------------------------------------------------------------------------------- Physical Exam Details Patient Name: Date of Service: [name] [name], [place] 07/22/2022 11:00 A M Medical Record Number: [id number] Patient Account Number: [id number] Date of Birth/Sex: Treating RN: [birth year] (62 y.o. [name] [name] Primary Care Provider: Leonia [name], [name] [name] Other Clinician: Referring Provider: Treating Provider/Extender: [name] [name] in  Treatment:  2 Constitutional Well-nourished and well-hydrated in no acute distress. Respiratory normal breathing without difficulty. Psychiatric this patient is able to make decisions and demonstrates good insight into disease process. Alert and Oriented x 3. pleasant and cooperative. Notes Upon inspection patient's leg does not show any true open wounds this is more just a cellulitis issue to be peripherally honest. I do believe that she probably needs to be seen in the ER for IV antibiotics being that oral is not working she is on 2 weeks of the Bactrim this actually has gotten worse. With that being said she continues to refuse to go to the ER she tells me that she is just not able to right now because she has too much going on and cannot be out of work. She is getting work however the symptoms of FMLA paperwork which I will be happy to fill out. Electronic Signature(s) Signed: 07/22/2022 11:34:01 AM By: [name] Derry PA-C Entered By: [name] [name] on 07/22/2022 11:34:00 -------------------------------------------------------------------------------- Physician Orders Details Patient Name: Date of Service: [name] Dell, MA RY 07/22/2022 11:00 A M Medical Record Number: [id number] Patient Account Number: [id number] Date of Birth/Sex: Treating RN: Sep 22, 1960 (62 y.o. [name] [name] Primary Care Provider: Leonia [name], [name] [name] Other Clinician: Referring Provider: Treating Provider/Extender: [name] [name] in Treatment: 2 Verbal / Phone Orders: No Diagnosis Coding CAELYN, SANTORE ([id number]) [id number] Page 3 of 6 ICD-10 Coding Code Description L03.116 Cellulitis of left lower limb I87.332 Chronic venous hypertension (idiopathic) with ulcer and inflammation of left lower extremity I89.0 Lymphedema, not elsewhere classified L97.822 Non-pressure chronic ulcer of other part of left lower leg with fat layer exposed I10 Essential (primary) hypertension Follow-up  Appointments Return Appointment in 1 week. Bathing/ Shower/ Hygiene May shower; gently cleanse wound with antibacterial soap, rinse and pat dry prior to dressing wounds Edema Control - Lymphedema / Segmental Compressive Device / Other Tubigrip single layer applied. - E Additional Orders / Instructions Go to Emergency Department of your choice for evaluation and treatment. - PA encouraged patient to go to ER for IV antibiotics, patient declined at this time. Wound Treatment Wound #1 - Lower Leg Wound Laterality: Left, Circumferential Cleanser: Soap and Water 1 x Per Day/30 Days Discharge Instructions: Gently cleanse wound with antibacterial soap, rinse and pat dry prior to dressing wounds Secondary Dressing: ABD Pad 5x9 (in/in) 1 x Per Day/30 Days Discharge Instructions: Cover with ABD pad Secondary Dressing: Kerlix 4.5 x 4.1 (in/yd) (Generic) 1 x Per Day/30 Days Discharge Instructions: Apply Kerlix 4.5 x 4.1 (in/yd) as instructed Secured With: Medipore T - 27M Medipore H Soft Cloth Surgical T ape ape, 2x2 (in/yd) (Generic) 1 x Per Day/30 Days Secured With: Tubigrip Size E, 3.5x10 (in/yds) 1 x Per Day/30 Days Discharge Instructions: Apply 3 Tubigrip E 3-finger-widths below knee to base of toes to secure dressing and/or for swelling. Patient Medications llergies: No Known Allergies A Notifications Medication Indication Start End 07/22/2022 doxycycline hyclate DOSE 1 - oral 100 mg capsule - 1 capsule oral twice a day x 14 days Electronic Signature(s) Signed: 07/22/2022 11:35:41 AM By: [name] Derry PA-C Entered By: [name] [name] on 07/22/2022 11:35:41 -------------------------------------------------------------------------------- Problem List Details Patient Name: Date of Service: [name] Dell, MA RY 07/22/2022 11:00 A M Medical Record Number: [id number] Patient Account Number: [id number] Date of Birth/Sex: Treating RN: 25-Dec-1960 (61 y.o. [name] [name] Primary Care Provider: Leonia [name], [name] [name]  Other Clinician: Referring Provider: Treating Provider/Extender: [name] [name] in Treatment: 2 [name],  [name] ([id number]) [id number] Page 4 of 6 Active Problems ICD-10 Encounter Code Description Active Date MDM Diagnosis L03.116 Cellulitis of left lower limb 07/08/2022 No Yes I87.332 Chronic venous hypertension (idiopathic) with ulcer and inflammation of left 07/08/2022 No Yes lower extremity I89.0 Lymphedema, not elsewhere classified 07/08/2022 No Yes L97.822 Non-pressure chronic ulcer of other part of left lower leg with fat layer exposed6/21/2024 No Yes I10 Essential (primary) hypertension 07/08/2022 No Yes Inactive Problems Resolved Problems Electronic Signature(s) Signed: 07/22/2022 12:56:26 PM By: [name] Derry PA-C Signed: 07/25/2022 5:27:27 PM By: [name] Aver MSN RN CNS WTA Previous Signature: 07/22/2022 11:13:27 AM Version By: [name] Derry PA-C Entered By: [name] [name] on 07/22/2022 11:32:41 -------------------------------------------------------------------------------- Progress Note Details Patient Name: Date of Service: [name] Dell, MA RY 07/22/2022 11:00 A M Medical Record Number: [id number] Patient Account Number: [id number] Date of Birth/Sex: Treating RN: 11-Aug-1960 (61 y.o. [name] [name] Primary Care Provider: Leonia [name], [name] [name] Other Clinician: Referring Provider: Treating Provider/Extender: Babs Bertin Weeks in Treatment: 2 Subjective Chief Complaint Information obtained from Patient Left LE Cellulitis History of Present Illness (HPI) 07-08-2022 upon evaluation today patient presents for initial inspection here in our clinic she was actually referred by primary care she actually appears to have more cellulitis than a true wound on the left lower extremity. This is hot and warm to touch it is draining that there is not really a good place I can obtain a wound culture from May which is showing more superficial  bacteria which would not really be terribly helpful at this point. For that reason what I can do anything in that regard. The patient tells me this is an intermittent history over the past 3 years of this occurring she has not been on antibiotics for this episode. With that being said she tells me that this is hurting quite a bit she was given tramadol before she was referred to [place]. She does have a history of chronic venous insufficiency for sure she also has what appears to be lymphedema based on what I am seeing. Other than that she does have hypertension no other major medical problems. [name] [name], [name] [name] ([id number]) [id number] Page 5 of 6 07-14-2022 upon evaluation patient unfortunately is having issues with what appears to be cellulitis at this time. I am not pleased with the progress that she is made since last week I put her on Bactrim and I do not see a significant improvement she is not draining quite as much but she also has been off work for couple days she tells me when she works this tends to drain even more. Fortunately I do not see any signs of systemic infection at the moment although she tells me she in general just does not feel well. 07-22-2022 upon evaluation today patient's leg actually looks worse. She has not heard from infectious disease as of yet based on what she is telling me she looked back and stated she did not see because that she got it from them. With that being said I think that she needs to give them a call today to try to get this scheduled as quickly as possible. Objective Constitutional Well-nourished and well-hydrated in no acute distress. Vitals Time Taken: 11:11 AM, Height: 69 in, Weight: 254 lbs, BMI: 37.5, Temperature: 98.1 F, Pulse: 76 bpm, Respiratory Rate: 18 breaths/min, Blood Pressure: 139/67 mmHg. Respiratory normal breathing without difficulty. Psychiatric this patient is able to make decisions and demonstrates good insight  into disease process. Alert and Oriented x 3.  pleasant and cooperative. General Notes: Upon inspection patient's leg does not show any true open wounds this is more just a cellulitis issue to be peripherally honest. I do believe that she probably needs to be seen in the ER for IV antibiotics being that oral is not working she is on 2 weeks of the Bactrim this actually has gotten worse. With that being said she continues to refuse to go to the ER she tells me that she is just not able to right now because she has too much going on and cannot be out of work. She is getting work however the symptoms of FMLA paperwork which I will be happy to fill out. Integumentary (Hair, Skin) Wound #1 status is Open. Original cause of wound was Gradually Appeared. The date acquired was: 06/18/2019. The wound has been in treatment 2 weeks. The wound is located on the Left,Circumferential Lower Leg. The wound measures 0.2cm length x 0.2cm width x 0.2cm depth; 0.031cm^2 area and 0.006cm^3 volume. There is Fat Layer (Subcutaneous Tissue) exposed. There is a large amount of serous drainage noted. The wound margin is flat and intact. There is no granulation within the wound bed. There is a large (67-100%) amount of necrotic tissue within the wound bed including Adherent Slough. 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 Bathing/ Shower/ Hygiene: May shower; gently cleanse wound with antibacterial soap, rinse and pat dry prior to dressing wounds Edema Control - Lymphedema / Segmental Compressive Device / Other: Tubigrip single layer applied. - E Additional Orders / Instructions: Go to Emergency  Department of your choice for evaluation and treatment. - PA encouraged patient to go to ER for IV antibiotics, patient declined at this time. The following medication(s) was prescribed: doxycycline hyclate oral 100 mg capsule 1 1 capsule oral twice a day x 14 days starting 07/22/2022 WOUND #1: - Lower Leg Wound Laterality: Left, Circumferential Cleanser: Soap and Water 1 x Per Day/30 Days Discharge Instructions: Gently cleanse wound with antibacterial soap, rinse and pat dry prior to dressing wounds Secondary Dressing: ABD Pad 5x9 (in/in) 1 x Per Day/30 Days Discharge Instructions: Cover with ABD pad Secondary Dressing: Kerlix 4.5 x 4.1 (in/yd) (Generic) 1 x Per Day/30 Days Discharge Instructions: Apply Kerlix 4.5 x 4.1 (in/yd) as instructed Secured With: Medipore T - 76M Medipore H Soft Cloth Surgical T ape ape, 2x2 (in/yd) (Generic) 1 x Per Day/30 Days Secured With: Tubigrip Size E, 3.5x10 (in/yds) 1 x Per Day/30 Days Discharge Instructions: Apply 3 Tubigrip E 3-finger-widths below knee to base of toes to secure dressing and/or for swelling. [name] [name], [name] [name] ([id number]) [id number] Page 6 of 6 1. Based on what I am seeing my recommendation is the patient needs to go to the ER. This is actually worse than last week and already last week I was concerned about her health and wellbeing. She however tells me that she just cannot do that right now that if she is not doing better by the time she gets off work on Tuesday she "may go then". 2. I am good recommend for now that we continue with the ABD pads and then the roll gauze followed by the Tubigrip to catch the drainage. 3. I am going to give her a prescription for doxycycline  I do not know that this is going to do the job being that this has been getting worse not better but again I am trying to do what I can do since she is refusing to go to the ER at this point I do not think this is ideal but I think it is again the best  that I can do in light of the situation and circumstances I want to try to get her better she does need to call infectious disease and get that appointment scheduled ASAP with him as well. We will see patient back for reevaluation in 4 weeks here in the clinic. If anything worsens or changes patient will contact our office for additional recommendations. Electronic Signature(s) Signed: 07/22/2022 11:36:01 AM By: [name] Derry PA-C Entered By: [name] [name] on 07/22/2022 11:36:00 -------------------------------------------------------------------------------- SuperBill Details Patient Name: Date of Service: [name] Dell, MA RY 07/22/2022 Medical Record Number: [id number] Patient Account Number: [id number] Date of Birth/Sex: Treating RN: 1960/04/27 (61 y.o. [name] [name] Primary Care Provider: Leonia [name], [name] [name] Other Clinician: Referring Provider: Treating Provider/Extender: Babs Bertin Weeks in Treatment: 2 Diagnosis Coding ICD-10 Codes Code Description L03.116 Cellulitis of left lower limb I87.332 Chronic venous hypertension (idiopathic) with ulcer and inflammation of left lower extremity I89.0 Lymphedema, not elsewhere classified L97.822 Non-pressure chronic ulcer of other part of left lower leg with fat layer exposed I10 Essential (primary) hypertension Facility Procedures : CPT4 Code: [id number] Description: 99213 - WOUND CARE VISIT-LEV 3 EST PT Modifier: Quantity: 1 Physician Procedures : CPT4 Code Description Modifier [id number] - WC PHYS LEVEL 3 - EST PT ICD-10 Diagnosis Description L03.116 Cellulitis of left lower limb I87.332 Chronic venous hypertension (idiopathic) with ulcer and inflammation of left lower extremity I89.0  Lymphedema, not elsewhere classified L97.822 Non-pressure chronic ulcer of other part of left lower leg with fat layer exposed Quantity: 1 Electronic Signature(s) Signed: 07/22/2022 11:36:12 AM By: [name] Derry PA-C Entered By: [name] [name] on  07/22/2022 11:36:12

## 2022-07-25 NOTE — Progress Notes (Signed)
DYANE, SZARKA ([id number]) [id number] Page 1 of 9 Visit Report for 07/22/2022 Arrival Information Details Patient Name: Date of Service: [name] [name], [name] [name] 07/22/2022 11:00 A M Medical Record Number: [id number] Patient Account Number: [id number] Date of Birth/Sex: Treating RN: 1960/04/03 (62 y.o. [name] [name] Primary Care Vinaya Sancho: Leonia Reader, Barbara Cower Other Clinician: Referring Narcissa Melder: Treating Lene Mckay/Extender: Thornell Mule in Treatment: 2 Visit Information History Since Last Visit Added or deleted any medications: No Patient Arrived: Ambulatory Any new allergies or adverse reactions: No Arrival Time: 11:10 Has Dressing in Place as Prescribed: Yes Accompanied By: self Pain Present Now: No Transfer Assistance: None Patient Identification Verified: Yes Secondary Verification Process Completed: Yes Patient Requires Transmission-Based Precautions: No Patient Has Alerts: Yes Patient Alerts: Not Diabetic Electronic Signature(s) Signed: 07/25/2022 5:27:27 PM By: Midge Aver MSN RN CNS WTA Entered By: Midge Aver on 07/22/2022 11:11:02 -------------------------------------------------------------------------------- Clinic Level of Care Assessment Details Patient Name: Date of Service: [name] [name], [name] [name] 07/22/2022 11:00 A M Medical Record Number: [id number] Patient Account Number: [id number] Date of Birth/Sex: Treating RN: [birth year] (61 y.o. [name] [name] Primary Care Arvell Pulsifer: Leonia Reader, Barbara Cower Other Clinician: Referring Nupur Hohman: Treating Aliene Tamura/Extender: Thornell Mule in Treatment: 2 Clinic Level of Care Assessment Items TOOL 4 Quantity Score X- 1 0 Use when only an EandM is performed on FOLLOW-UP visit ASSESSMENTS - Nursing Assessment / Reassessment X- 1 10 Reassessment of Co-morbidities (includes updates in patient status) X- 1 5 Reassessment of Adherence to Treatment Plan ASSESSMENTS - Wound and Skin A ssessment /  Reassessment X - Simple Wound Assessment / Reassessment - one wound 1 5 [name] [name], [name] [name] ([id number]) [id number] Page 2 of 9 []  - 0 Complex Wound Assessment / Reassessment - multiple wounds []  - 0 Dermatologic / Skin Assessment (not related to wound area) ASSESSMENTS - Focused Assessment X- 1 5 Circumferential Edema Measurements - multi extremities []  - 0 Nutritional Assessment / Counseling / Intervention []  - 0 Lower Extremity Assessment (monofilament, tuning fork, pulses) []  - 0 Peripheral Arterial Disease Assessment (using hand held doppler) ASSESSMENTS - Ostomy and/or Continence Assessment and Care []  - 0 Incontinence Assessment and Management []  - 0 Ostomy Care Assessment and Management (repouching, etc.) PROCESS - Coordination of Care X - Simple Patient / Family Education for ongoing care 1 15 []  - 0 Complex (extensive) Patient / Family Education for ongoing care X- 1 10 Staff obtains Chiropractor, Records, T Results / Process Orders est []  - 0 Staff telephones HHA, Nursing Homes / Clarify orders / etc []  - 0 Routine Transfer to another Facility (non-emergent condition) []  - 0 Routine Hospital Admission (non-emergent condition) []  - 0 New Admissions / Manufacturing engineer / Ordering NPWT Apligraf, etc. , []  - 0 Emergency Hospital Admission (emergent condition) X- 1 10 Simple Discharge Coordination []  - 0 Complex (extensive) Discharge Coordination PROCESS - Special Needs []  - 0 Pediatric / Minor Patient Management []  - 0 Isolation Patient Management []  - 0 Hearing / Language / Visual special needs []  - 0 Assessment of Community assistance (transportation, D/C planning, etc.) []  - 0 Additional assistance / Altered mentation []  - 0 Support Surface(s) Assessment (bed, cushion, seat, etc.) INTERVENTIONS - Wound Cleansing / Measurement X - Simple Wound Cleansing - one wound 1 5 []  - 0 Complex Wound Cleansing - multiple wounds X- 1  5 Wound Imaging (photographs - any number of wounds) []  - 0 Wound Tracing (instead of photographs) X- 1 5 Simple Wound Measurement - one  wound []  - 0 Complex Wound Measurement - multiple wounds INTERVENTIONS - Wound Dressings X - Small Wound Dressing one or multiple wounds 1 10 []  - 0 Medium Wound Dressing one or multiple wounds []  - 0 Large Wound Dressing one or multiple wounds []  - 0 Application of Medications - topical []  - 0 Application of Medications - injection INTERVENTIONS - Miscellaneous []  - 0 External ear exam []  - 0 Specimen Collection (cultures, biopsies, blood, body fluids, etc.) []  - 0 Specimen(s) / Culture(s) sent or taken to Lab for analysis [name] [name], [name] [name] ([id number]) [id number] Page 3 of 9 []  - 0 Patient Transfer (multiple staff / Nurse, adult / Similar devices) []  - 0 Simple Staple / Suture removal (25 or less) []  - 0 Complex Staple / Suture removal (26 or more) []  - 0 Hypo / Hyperglycemic Management (close monitor of Blood Glucose) []  - 0 Ankle / Brachial Index (ABI) - do not check if billed separately X- 1 5 Vital Signs Has the patient been seen at the hospital within the last three years: Yes Total Score: 90 Level Of Care: New/Established - Level 3 Electronic Signature(s) Signed: 07/25/2022 5:27:27 PM By: Midge Aver MSN RN CNS WTA Entered By: Midge Aver on 07/22/2022 11:31:24 -------------------------------------------------------------------------------- Encounter Discharge Information Details Patient Name: Date of Service: [name] [name], [name] [name] 07/22/2022 11:00 A M Medical Record Number: [id number] Patient Account Number: [id number] Date of Birth/Sex: Treating RN: 27-Jan-1960 (61 y.o. [name] [name] Primary Care Debi Cousin: Leonia Reader, Barbara Cower Other Clinician: Referring Avantika Shere: Treating Jeorge Reister/Extender: Thornell Mule in Treatment: 2 Encounter Discharge Information Items Discharge Condition:  Stable Ambulatory Status: Ambulatory Discharge Destination: Home Transportation: Private Auto Accompanied By: self Schedule Follow-up Appointment: Yes Clinical Summary of Care: Electronic Signature(s) Signed: 07/25/2022 5:27:27 PM By: Midge Aver MSN RN CNS WTA Entered By: Midge Aver on 07/22/2022 11:33:22 -------------------------------------------------------------------------------- Lower Extremity Assessment Details Patient Name: Date of Service: [name] [name], [name] [name] 07/22/2022 11:00 A M Medical Record Number: [id number] Patient Account Number: [id number] Date of Birth/Sex: Treating RN: 12-17-1960 (61 y.o. [name] [name] Primary Care Kailand Seda: Leonia Reader, Barbara Cower Other Clinician: [name] ([id number]) [id number] Page 4 of 9 Referring Hilberto Burzynski: Treating Yuvin Bussiere/Extender: Thornell Mule in Treatment: 2 Edema Assessment Assessed: [Left: No] [Right: No] [Left: Edema] [Right: :] Calf Left: Right: Point of Measurement: 34 cm From Medial Instep 48.5 cm Ankle Left: Right: Point of Measurement: 12 cm From Medial Instep 33.5 cm Vascular Assessment Pulses: Dorsalis Pedis Palpable: [Left:Yes] Electronic Signature(s) Signed: 07/25/2022 5:27:27 PM By: Midge Aver MSN RN CNS WTA Entered By: Midge Aver on 07/22/2022 11:21:57 -------------------------------------------------------------------------------- Multi Wound Chart Details Patient Name: Date of Service: [name] [name], [name] [name] 07/22/2022 11:00 A M Medical Record Number: [id number] Patient Account Number: [id number] Date of Birth/Sex: Treating RN: [birth year] (61 y.o. [name] [name] Primary Care Jossalyn Forgione: Leonia Reader, Barbara Cower Other Clinician: Referring Daemian Gahm: Treating Ramani Riva/Extender: Babs Bertin Weeks in Treatment: 2 Vital Signs Height(in): 69 Pulse(bpm): 76 Weight(lbs): 254 Blood Pressure(mmHg): 139/67 Body Mass Index(BMI): 37.5 Temperature(F): 98.1 Respiratory  Rate(breaths/min): 18 [1:Photos:] [N/A:N/A] Left, Circumferential Lower Leg N/A N/A Wound Location: Gradually Appeared N/A N/A Wounding Event: Cellulitis N/A N/A Primary Etiology: Hypertension N/A N/A Comorbid History: 06/18/2019 N/A N/A Date Acquired: 2 N/A N/A [name] [name] of TreatmentChales Abrahams ([id number]) [id number] Page 5 of 9 Open N/A N/A Wound Status: No N/A N/A Wound Recurrence: Yes N/A N/A Clustered Wound: 0.2x0.2x0.2 N/A N/A Measurements L x W x D (cm) 0.031 N/A  N/A A (cm) : rea 0.006 N/A N/A Volume (cm) : 100.00% N/A N/A % Reduction in Area: 100.00% N/A N/A % Reduction in Volume: Full Thickness Without Exposed N/A N/A Classification: Support Structures Large N/A N/A Exudate Amount: Serous N/A N/A Exudate Type: amber N/A N/A Exudate Color: Flat and Intact N/A N/A Wound Margin: None Present (0%) N/A N/A Granulation Amount: Large (67-100%) N/A N/A Necrotic Amount: Fat Layer (Subcutaneous Tissue): Yes N/A N/A Exposed Structures: Fascia: No Tendon: No Muscle: No Joint: No Bone: No Large (67-100%) N/A N/A Epithelialization: Treatment Notes Electronic Signature(s) Signed: 07/25/2022 5:27:27 PM By: Midge Aver MSN RN CNS WTA Entered By: Midge Aver on 07/22/2022 11:22:26 -------------------------------------------------------------------------------- Multi-Disciplinary Care Plan Details Patient Name: Date of Service: [name] [name], [name] [name] 07/22/2022 11:00 A M Medical Record Number: [id number] Patient Account Number: [id number] Date of Birth/Sex: Treating RN: 1960/05/15 (61 y.o. [name] [name] Primary Care Jakiera Ehler: Leonia Reader, Barbara Cower Other Clinician: Referring Erilyn Pearman: Treating Gianelle Mccaul/Extender: Thornell Mule in Treatment: 2 Active Inactive Soft Tissue Infection Nursing Diagnoses: Impaired tissue integrity Potential for infection: soft tissue Goals: Patient/caregiver will verbalize understanding of or  measures to prevent infection and contamination in the home setting Date Initiated: 07/08/2022 Target Resolution Date: 08/07/2022 Goal Status: Active Patient's soft tissue infection will resolve Date Initiated: 07/08/2022 Target Resolution Date: 08/07/2022 Goal Status: Active Signs and symptoms of infection will be recognized early to allow for prompt treatment Date Initiated: 07/08/2022 Target Resolution Date: 08/07/2022 Goal Status: Active Interventions: Assess signs and symptoms of infection every visit Provide education on infection [place], [name] [name] ([id number]) [id number] Page 6 of 9 Treatment Activities: Systemic antibiotics : 07/08/2022 Notes: Wound/Skin Impairment Nursing Diagnoses: Impaired tissue integrity Knowledge deficit related to smoking impact on wound healing Knowledge deficit related to ulceration/compromised skin integrity Goals: Patient will demonstrate a reduced rate of smoking or cessation of smoking Date Initiated: 07/08/2022 Target Resolution Date: 08/07/2022 Goal Status: Active Patient/caregiver will verbalize understanding of skin care regimen Date Initiated: 07/08/2022 Target Resolution Date: 08/07/2022 Goal Status: Active Ulcer/skin breakdown will have a volume reduction of 30% by week 4 Date Initiated: 07/08/2022 Target Resolution Date: 08/07/2022 Goal Status: Active Ulcer/skin breakdown will have a volume reduction of 50% by week 8 Date Initiated: 07/08/2022 Target Resolution Date: 09/07/2022 Goal Status: Active Interventions: Assess patient/caregiver ability to obtain necessary supplies Assess patient/caregiver ability to perform ulcer/skin care regimen upon admission and as needed Assess ulceration(s) every visit Provide education on smoking Provide education on ulcer and skin care Treatment Activities: Referred to DME Teo Moede for dressing supplies : 07/08/2022 Skin care regimen initiated : 07/08/2022 Notes: Electronic  Signature(s) Signed: 07/25/2022 5:27:27 PM By: Midge Aver MSN RN CNS WTA Entered By: Midge Aver on 07/22/2022 11:31:47 -------------------------------------------------------------------------------- Pain Assessment Details Patient Name: Date of Service: [name] [name], [name] [name] 07/22/2022 11:00 A M Medical Record Number: [id number] Patient Account Number: [id number] Date of Birth/Sex: Treating RN: [birth year] (61 y.o. [name] [name] Primary Care Miya Luviano: Leonia Reader, Barbara Cower Other Clinician: Referring Sary Bogie: Treating Yaremi Stahlman/Extender: Babs Bertin Weeks in Treatment: 2 Active Problems Location of Pain Severity and Description of Pain Patient Has Paino No Site Locations [place], [place] ([id number]) [phone].pdf Page 7 of 9 Pain Management and Medication Current Pain Management: Electronic Signature(s) Signed: 07/25/2022 5:27:27 PM By: Midge Aver MSN RN CNS WTA Entered By: Midge Aver on 07/22/2022 11:13:19 -------------------------------------------------------------------------------- Patient/Caregiver Education Details Patient Name: Date of Service: [name] [name], [name] [name] 7/5/2024andnbsp11:00 A M Medical Record Number: [id number] Patient Account Number: [id number] Date  of Birth/Gender: Treating RN: 1960-07-03 (61 y.o. [name] [name] Primary Care Physician: Leonia Reader, Barbara Cower Other Clinician: Referring Physician: Treating Physician/Extender: Thornell Mule in Treatment: 2 Education Assessment Education Provided To: Patient Education Topics Provided CDC - Proper Use of Antibiotics: Methods: Explain/Verbal Responses: State content correctly Infection: Handouts: Hygiene and Infection Prevention Methods: Explain/Verbal Responses: State content correctly Wound/Skin Impairment: Handouts: Caring for Your Ulcer Methods: Explain/Verbal Responses: State content correctly Electronic Signature(s) Signed: 07/25/2022 5:27:27 PM By: Midge Aver  MSN RN CNS Lattie Corns, [name] [name] ([id number]) [id number] Page 8 of 9 Entered By: Midge Aver on 07/22/2022 11:32:27 -------------------------------------------------------------------------------- Wound Assessment Details Patient Name: Date of Service: [name] [name], [name] [name] 07/22/2022 11:00 A M Medical Record Number: [id number] Patient Account Number: [id number] Date of Birth/Sex: Treating RN: May 13, 1960 (62 y.o. [name] [name] Primary Care Mariama Saintvil: Leonia Reader, Barbara Cower Other Clinician: Referring Shigeko Manard: Treating Delberta Folts/Extender: Babs Bertin Weeks in Treatment: 2 Wound Status Wound Number: 1 Primary Etiology: Cellulitis Wound Location: Left, Circumferential Lower Leg Wound Status: Open Wounding Event: Gradually Appeared Comorbid History: Hypertension Date Acquired: 06/18/2019 Weeks Of Treatment: 2 Clustered Wound: Yes Photos Wound Measurements Length: (cm) 0.2 Width: (cm) 0.2 Depth: (cm) 0.2 Area: (cm) 0.031 Volume: (cm) 0.006 % Reduction in Area: 100% % Reduction in Volume: 100% Epithelialization: Large (67-100%) Wound Description Classification: Full Thickness Without Exposed Support Wound Margin: Flat and Intact Exudate Amount: Large Exudate Type: Serous Exudate Color: amber Structures Foul Odor After Cleansing: No Slough/Fibrino Yes Wound Bed Granulation Amount: None Present (0%) Exposed Structure Necrotic Amount: Large (67-100%) Fascia Exposed: No Necrotic Quality: Adherent Slough Fat Layer (Subcutaneous Tissue) Exposed: Yes Tendon Exposed: No Muscle Exposed: No Joint Exposed: No Bone Exposed: No Treatment Notes Wound #1 (Lower Leg) Wound Laterality: Left, Circumferential Kandler, Suesan ([id number]) [id number] Page 9 of 9 Cleanser Soap and Water Discharge Instruction: Gently cleanse wound with antibacterial soap, rinse and pat dry prior to dressing wounds Peri-Wound Care Topical Primary  Dressing Secondary Dressing ABD Pad 5x9 (in/in) Discharge Instruction: Cover with ABD pad Kerlix 4.5 x 4.1 (in/yd) Discharge Instruction: Apply Kerlix 4.5 x 4.1 (in/yd) as instructed Secured With Medipore T - 59M Medipore H Soft Cloth Surgical T ape ape, 2x2 (in/yd) Tubigrip Size E, 3.5x10 (in/yds) Discharge Instruction: Apply 3 Tubigrip E 3-finger-widths below knee to base of toes to secure dressing and/or for swelling. Compression Wrap Compression Stockings Add-Ons Electronic Signature(s) Signed: 07/25/2022 5:27:27 PM By: Midge Aver MSN RN CNS WTA Entered By: Midge Aver on 07/22/2022 11:20:43 -------------------------------------------------------------------------------- Vitals Details Patient Name: Date of Service: [name] [name], [name] [name] 07/22/2022 11:00 A M Medical Record Number: [id number] Patient Account Number: [id number] Date of Birth/Sex: Treating RN: 1960/08/14 (61 y.o. [name] [name] Primary Care Kaelan Emami: Leonia Reader, Barbara Cower Other Clinician: Referring Kiko Ripp: Treating Shahd Occhipinti/Extender: Thornell Mule in Treatment: 2 Vital Signs Time Taken: 11:11 Temperature (F): 98.1 Height (in): 69 Pulse (bpm): 76 Weight (lbs): 254 Respiratory Rate (breaths/min): 18 Body Mass Index (BMI): 37.5 Blood Pressure (mmHg): 139/67 Reference Range: 80 - 120 mg / dl Electronic Signature(s) Signed: 07/25/2022 5:27:27 PM By: Midge Aver MSN RN CNS WTA Entered By: Midge Aver on 07/22/2022 11:13:14

## 2022-07-28 ENCOUNTER — Encounter: Payer: Self-pay | Admitting: Infectious Diseases

## 2022-07-28 ENCOUNTER — Ambulatory Visit: Payer: Commercial Managed Care - PPO | Attending: Infectious Diseases | Admitting: Infectious Diseases

## 2022-07-28 ENCOUNTER — Encounter: Payer: Commercial Managed Care - PPO | Admitting: Physician Assistant

## 2022-07-28 ENCOUNTER — Other Ambulatory Visit
Admission: RE | Admit: 2022-07-28 | Discharge: 2022-07-28 | Disposition: A | Discharge status: Home / Self Care | Payer: Commercial Managed Care - PPO | Source: Ambulatory Visit | Attending: Infectious Diseases | Admitting: Infectious Diseases

## 2022-07-28 VITALS — BP 153/85 | HR 73 | Temp 96.4°F | Ht 69.0 in | Wt 244.0 lb

## 2022-07-28 DIAGNOSIS — F17210 Nicotine dependence, cigarettes, uncomplicated: Secondary | ICD-10-CM | POA: Diagnosis not present

## 2022-07-28 DIAGNOSIS — R110 Nausea: Secondary | ICD-10-CM | POA: Diagnosis not present

## 2022-07-28 DIAGNOSIS — I87332 Chronic venous hypertension (idiopathic) with ulcer and inflammation of left lower extremity: Secondary | ICD-10-CM | POA: Diagnosis not present

## 2022-07-28 DIAGNOSIS — L03116 Cellulitis of left lower limb: Secondary | ICD-10-CM | POA: Insufficient documentation

## 2022-07-28 DIAGNOSIS — I872 Venous insufficiency (chronic) (peripheral): Secondary | ICD-10-CM | POA: Insufficient documentation

## 2022-07-28 LAB — CBC WITH DIFFERENTIAL/PLATELET
Abs Immature Granulocytes: 0.01 10*3/uL (ref 0.00–0.07)
Basophils Absolute: 0 10*3/uL (ref 0.0–0.1)
Basophils Relative: 1 %
Eosinophils Absolute: 0.2 10*3/uL (ref 0.0–0.5)
Eosinophils Relative: 5 %
HCT: 35.4 % — ABNORMAL LOW (ref 36.0–46.0)
Hemoglobin: 11.4 g/dL — ABNORMAL LOW (ref 12.0–15.0)
Immature Granulocytes: 0 %
Lymphocytes Relative: 42 %
Lymphs Abs: 2 10*3/uL (ref 0.7–4.0)
MCH: 31.4 pg (ref 26.0–34.0)
MCHC: 32.2 g/dL (ref 30.0–36.0)
MCV: 97.5 fL (ref 80.0–100.0)
Monocytes Absolute: 0.4 10*3/uL (ref 0.1–1.0)
Monocytes Relative: 8 %
Neutro Abs: 2.2 10*3/uL (ref 1.7–7.7)
Neutrophils Relative %: 44 %
Platelets: 233 10*3/uL (ref 150–400)
RBC: 3.63 MIL/uL — ABNORMAL LOW (ref 3.87–5.11)
RDW: 13.9 % (ref 11.5–15.5)
WBC: 4.8 10*3/uL (ref 4.0–10.5)
nRBC: 0 % (ref 0.0–0.2)

## 2022-07-28 LAB — COMPREHENSIVE METABOLIC PANEL
ALT: 22 U/L (ref 0–44)
AST: 19 U/L (ref 15–41)
Albumin: 3.7 g/dL (ref 3.5–5.0)
Alkaline Phosphatase: 125 U/L (ref 38–126)
Anion gap: 9 (ref 5–15)
BUN: 21 mg/dL — ABNORMAL HIGH (ref 6–20)
CO2: 25 mmol/L (ref 22–32)
Calcium: 9 mg/dL (ref 8.9–10.3)
Chloride: 107 mmol/L (ref 98–111)
Creatinine, Ser: 0.67 mg/dL (ref 0.44–1.00)
GFR, Estimated: >60 mL/min (ref >60)
Glucose, Bld: 97 mg/dL (ref 70–99)
Potassium: 3.4 mmol/L — ABNORMAL LOW (ref 3.5–5.1)
Sodium: 141 mmol/L (ref 135–145)
Total Bilirubin: 0.5 mg/dL (ref 0.3–1.2)
Total Protein: 7.1 g/dL (ref 6.5–8.1)

## 2022-07-28 LAB — HEPATITIS C ANTIBODY: HCV Ab: NONREACTIVE

## 2022-07-28 LAB — C-REACTIVE PROTEIN: CRP: 1 mg/dL — ABNORMAL HIGH (ref <1.0)

## 2022-07-28 LAB — SEDIMENTATION RATE: Sed Rate: 42 mm/hr — ABNORMAL HIGH (ref 0–30)

## 2022-07-28 LAB — TSH: TSH: 6.878 u[IU]/mL — ABNORMAL HIGH (ref 0.350–4.500)

## 2022-07-28 NOTE — Patient Instructions (Addendum)
You are here for this lesion in your left leg for 2 months and you had taken 2 courses of bactrim and now on doxy with no improvement this looks like venous stasis dermatitis rather than cellulitis- I have sent a culture, doing some labs and referring to a dermatologist- please stop doxycycline

## 2022-07-28 NOTE — Progress Notes (Addendum)
QUINTARA, BOST ([id number]) [id number] Page 1 of 6 Visit Report for 07/28/2022 Chief Complaint Document Details Patient Name: Date of Service: [name] [name], [place] 07/28/2022 9:45 A M Medical Record Number: [id number] Patient Account Number: [id number] Date of Birth/Sex: Treating RN: 1960-11-07 (62 y.o. Esmeralda Links Primary Care Provider: Leonia Reader, Barbara Cower Other Clinician: Referring Provider: Treating Provider/Extender: Thornell Mule in Treatment: 2 Information Obtained from: Patient Chief Complaint Left LE Cellulitis Electronic Signature(s) Signed: 07/28/2022 9:49:16 AM By: Allen Derry PA-C Entered By: Allen Derry on 07/28/2022 09:49:16 -------------------------------------------------------------------------------- HPI Details Patient Name: Date of Service: [name] Dell, [place] RY 07/28/2022 9:45 A M Medical Record Number: [id number] Patient Account Number: [id number] Date of Birth/Sex: Treating RN: Oct 19, 1960 (62 y.o. Esmeralda Links Primary Care Provider: Leonia Reader, Barbara Cower Other Clinician: Referring Provider: Treating Provider/Extender: Thornell Mule in Treatment: 2 History of Present Illness HPI Description: 07-08-2022 upon evaluation today patient presents for initial inspection here in our clinic she was actually referred by primary care she actually appears to have more cellulitis than a true wound on the left lower extremity. This is hot and warm to touch it is draining that there is not really a good place I can obtain a wound culture from May which is showing more superficial bacteria which would not really be terribly helpful at this point. For that reason what I can do anything in that regard. The patient tells me this is an intermittent history over the past 3 years of this occurring she has not been on antibiotics for this episode. With that being said she tells me that this is hurting quite a bit she was given  tramadol before she was referred to [place]. She does have a history of chronic venous insufficiency for sure she also has what appears to be lymphedema based on what I am seeing. Other than that she does have hypertension no other major medical problems. 07-14-2022 upon evaluation patient unfortunately is having issues with what appears to be cellulitis at this time. I am not pleased with the progress that she is made since last week I put her on Bactrim and I do not see a significant improvement she is not draining quite as much but she also has been off work for couple days she tells me when she works this tends to drain even more. Fortunately I do not see any signs of systemic infection at the moment although she tells me she in general just does not feel well. 07-22-2022 upon evaluation today patient's leg actually looks worse. She has not heard from infectious disease as of yet based on what she is telling me she looked back and stated she did not see because that she got it from them. With that being said I think that she needs to give them a call today to try to get this scheduled as quickly as possible. MAIKO, SALAIS ([id number]) [id number] Page 2 of 6 07-28-2022 upon evaluation today patient presents for follow-up she actually tells me that she is feeling quite bad. She subsequently actually has an appointment with infectious disease and just a few minutes she should be headed there from here. Nonetheless she knows that she needs to get something under control as far as his infection is concerned and unfortunately with what we have done as an outpatient she really has not noted a lot of improvement. There really is not any wound open at this point but she has significant cellulitis of this  lower extremity. Electronic Signature(s) Signed: 07/28/2022 11:08:10 AM By: Allen Derry PA-C Entered By: Allen Derry on 07/28/2022  11:08:10 -------------------------------------------------------------------------------- Physical Exam Details Patient Name: Date of Service: [name] [name], [place] 07/28/2022 9:45 A M Medical Record Number: [id number] Patient Account Number: [id number] Date of Birth/Sex: Treating RN: 08-Jan-1961 (62 y.o. Esmeralda Links Primary Care Provider: Leonia Reader, Barbara Cower Other Clinician: Referring Provider: Treating Provider/Extender: Babs Bertin Weeks in Treatment: 2 Constitutional Well-nourished and well-hydrated in no acute distress. Respiratory normal breathing without difficulty. Psychiatric this patient is able to make decisions and demonstrates good insight into disease process. Alert and Oriented x 3. pleasant and cooperative. Notes Upon inspection patient's wound bed actually showed signs of again significant cellulitis of the left lower extremity she is going to be seeing infectious disease shortly. I had her on doxycycline unfortunately this really has not improved her symptoms overall she still is having quite a bit of an issue at this point. Electronic Signature(s) Signed: 07/28/2022 11:08:31 AM By: Allen Derry PA-C Entered By: Allen Derry on 07/28/2022 11:08:31 -------------------------------------------------------------------------------- Physician Orders Details Patient Name: Date of Service: [name] Dell, MA RY 07/28/2022 9:45 A M Medical Record Number: [id number] Patient Account Number: [id number] Date of Birth/Sex: Treating RN: 29-Aug-1960 (62 y.o. Esmeralda Links Primary Care Provider: Leonia Reader, Barbara Cower Other Clinician: Referring Provider: Treating Provider/Extender: Thornell Mule in Treatment: 2 Verbal / Phone Orders: No Diagnosis Coding BRAYLEA, BRANCATO ([id number]) [id number] Page 3 of 6 ICD-10 Coding Code Description L03.116 Cellulitis of left lower limb I87.332 Chronic venous hypertension (idiopathic) with ulcer and  inflammation of left lower extremity I89.0 Lymphedema, not elsewhere classified L97.822 Non-pressure chronic ulcer of other part of left lower leg with fat layer exposed I10 Essential (primary) hypertension Follow-up Appointments Return Appointment in 1 week. Bathing/ Shower/ Hygiene May shower; gently cleanse wound with antibacterial soap, rinse and pat dry prior to dressing wounds Edema Control - Lymphedema / Segmental Compressive Device / Other Tubigrip single layer applied. - E Non-Wound Condition Left Lower Extremity Cleanse affected area with antibacterial soap and water, pply appropriate compression. - Tubi E A dditional non-wound orders/instructions: - ABD pads for weeping, kerlix to hold in place under tubi grip A Electronic Signature(s) Signed: 07/28/2022 12:42:15 PM By: Angelina Pih Signed: 07/28/2022 5:17:09 PM By: Allen Derry PA-C Entered By: Angelina Pih on 07/28/2022 12:42:15 -------------------------------------------------------------------------------- Problem List Details Patient Name: Date of Service: [name] Dell, MA RY 07/28/2022 9:45 A M Medical Record Number: [id number] Patient Account Number: [id number] Date of Birth/Sex: Treating RN: August 16, 1960 (61 y.o. Esmeralda Links Primary Care Provider: Leonia Reader, Barbara Cower Other Clinician: Referring Provider: Treating Provider/Extender: Babs Bertin Weeks in Treatment: 2 Active Problems ICD-10 Encounter Code Description Active Date MDM Diagnosis L03.116 Cellulitis of left lower limb 07/08/2022 No Yes I87.332 Chronic venous hypertension (idiopathic) with ulcer and inflammation of left 07/08/2022 No Yes lower extremity I89.0 Lymphedema, not elsewhere classified 07/08/2022 No Yes L97.822 Non-pressure chronic ulcer of other part of left lower leg with fat layer exposed6/21/2024 No Yes AMELIE, CARACCI ([id number]) [id number] Page 4 of 6 I10 Essential (primary) hypertension  07/08/2022 No Yes Inactive Problems Resolved Problems Electronic Signature(s) Signed: 07/28/2022 9:49:13 AM By: Allen Derry PA-C Entered By: Allen Derry on 07/28/2022 09:49:13 -------------------------------------------------------------------------------- Progress Note Details Patient Name: Date of Service: [name] [name], [name] 07/28/2022 9:45 A M Medical Record Number: [id number] Patient Account Number: [id number] Date of Birth/Sex: Treating RN: 29-Dec-1960 (61 y.o. Esmeralda Links Primary Care Provider:  Leonia Reader, Barbara Cower Other Clinician: Referring Provider: Treating Provider/Extender: Thornell Mule in Treatment: 2 Subjective Chief Complaint Information obtained from Patient Left LE Cellulitis History of Present Illness (HPI) 07-08-2022 upon evaluation today patient presents for initial inspection here in our clinic she was actually referred by primary care she actually appears to have more cellulitis than a true wound on the left lower extremity. This is hot and warm to touch it is draining that there is not really a good place I can obtain a wound culture from May which is showing more superficial bacteria which would not really be terribly helpful at this point. For that reason what I can do anything in that regard. The patient tells me this is an intermittent history over the past 3 years of this occurring she has not been on antibiotics for this episode. With that being said she tells me that this is hurting quite a bit she was given tramadol before she was referred to [place]. She does have a history of chronic venous insufficiency for sure she also has what appears to be lymphedema based on what I am seeing. Other than that she does have hypertension no other major medical problems. 07-14-2022 upon evaluation patient unfortunately is having issues with what appears to be cellulitis at this time. I am not pleased with the progress that she is made since last week I put her on  Bactrim and I do not see a significant improvement she is not draining quite as much but she also has been off work for couple days she tells me when she works this tends to drain even more. Fortunately I do not see any signs of systemic infection at the moment although she tells me she in general just does not feel well. 07-22-2022 upon evaluation today patient's leg actually looks worse. She has not heard from infectious disease as of yet based on what she is telling me she looked back and stated she did not see because that she got it from them. With that being said I think that she needs to give them a call today to try to get this scheduled as quickly as possible. 07-28-2022 upon evaluation today patient presents for follow-up she actually tells me that she is feeling quite bad. She subsequently actually has an appointment with infectious disease and just a few minutes she should be headed there from here. Nonetheless she knows that she needs to get something under control as far as his infection is concerned and unfortunately with what we have done as an outpatient she really has not noted a lot of improvement. There really is not any wound open at this point but she has significant cellulitis of this lower extremity. Objective Constitutional Well-nourished and well-hydrated in no acute distress. Vitals Time Taken: 9:55 AM, Height: 69 in, Weight: 254 lbs, BMI: 37.5, Temperature: 97.5 F, Pulse: 74 bpm, Respiratory Rate: 18 breaths/min, Blood Pressure: Gosney, [name] ([id number]) [id number] Page 5 of 6 136/81 mmHg. Respiratory normal breathing without difficulty. Psychiatric this patient is able to make decisions and demonstrates good insight into disease process. Alert and Oriented x 3. pleasant and cooperative. General Notes: Upon inspection patient's wound bed actually showed signs of again significant cellulitis of the left lower extremity she is going to be  seeing infectious disease shortly. I had her on doxycycline unfortunately this really has not improved her symptoms overall she still is having quite a bit of an issue at this point. Integumentary (Hair,  Skin) Wound #1 status is Healed - Epithelialized. Original cause of wound was Gradually Appeared. The date acquired was: 06/18/2019. The wound has been in treatment 2 weeks. The wound is located on the Left,Circumferential Lower Leg. The wound measures 0cm length x 0cm width x 0cm depth; 0cm^2 area and 0cm^3 volume. There is Fat Layer (Subcutaneous Tissue) exposed. There is no tunneling or undermining noted. There is a large amount of serous drainage noted. The wound margin is flat and intact. There is large (67-100%) red, pink granulation within the wound bed. There is no necrotic tissue within the wound bed. Other Condition(s) Patient presents with Cellulitis located on the Leg. Assessment Active Problems ICD-10 Cellulitis of left lower limb Chronic venous hypertension (idiopathic) with ulcer and inflammation of left lower extremity Lymphedema, not elsewhere classified Non-pressure chronic ulcer of other part of left lower leg with fat layer exposed Essential (primary) hypertension Plan 1. I would recommend that we have the patient continue to monitor for any signs of infection or worsening. Based on what I am seeing I do believe that we are making really poor headway towards getting the cellulitis resolved. She does have the appointment with infectious disease and just a few minutes and we will see what Dr. Judye Bos recommends as well. 2. I am also can recommend that she should continue to monitor for any signs of worsening overall and again that is my biggest concern that she has been getting worse as far as how she felt this has made her somewhat nauseated and pretty much lethargic in general she has been working but she has been having a difficult time with that. We will see patient  back for reevaluation in 1 week here in the clinic. If anything worsens or changes patient will contact our office for additional recommendations. Of note the patient does want me to fill out paperwork for intermittent leave of absence through [facility] and just in case she has to be out of work I am definitely okay to do that and the symptoms again and I will work on getting that filled out for her. Electronic Signature(s) Signed: 07/28/2022 11:09:27 AM By: Allen Derry PA-C Entered By: Allen Derry on 07/28/2022 11:09:27 -------------------------------------------------------------------------------- SuperBill Details Patient Name: Date of Service: [name] Dell, MA RY 07/28/2022 Medical Record Number: [id number] Patient Account Number: [id number] Date of Birth/Sex: Treating RN: [birth year] (61 y.o. 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 Essential (primary) hypertension Facility Procedures : CPT4 Code: [id number] Description: 99213 - WOUND CARE VISIT-LEV 3 EST PT Modifier: Quantity: 1 Physician Procedures : CPT4 Code Description Modifier [id number] 99213 - WC PHYS LEVEL 3 - EST PT ICD-10 Diagnosis Description L03.116 Cellulitis of left lower limb I87.332 Chronic venous hypertension (idiopathic) with ulcer and inflammation of left lower extremity I89.0  Lymphedema, not elsewhere classified L97.822 Non-pressure chronic ulcer of other part of left lower leg with fat layer  exposed Quantity: 1 Electronic Signature(s) Signed: 07/28/2022 12:43:03 PM By: Angelina Pih Signed: 07/28/2022 5:17:09 PM By: Allen Derry PA-C Previous Signature: 07/28/2022 11:09:48 AM Version By: Allen Derry PA-C Entered By: Angelina Pih  on 07/28/2022 12:43:02

## 2022-07-28 NOTE — Progress Notes (Addendum)
[name] [name] ([id number]) [id number] Page 1 of 10 Visit Report for 07/28/2022 Arrival Information Details Patient Name: Date of Service: [name] [name], [place] 07/28/2022 9:45 A M Medical Record Number: [id number] Patient Account Number: [id number] Date of Birth/Sex: Treating RN: [birth year] (62 y.o. Esmeralda Links Primary Care Aloysious Vangieson: Leonia Reader, Barbara Cower Other Clinician: Referring Marieta Markov: Treating Kayse Puccini/Extender: Thornell Mule in Treatment: 2 Visit Information History Since Last Visit Added or deleted any medications: No Patient Arrived: Ambulatory Any new allergies or adverse reactions: No Arrival Time: 09:54 Had a fall or experienced change in No Accompanied By: SELF activities of daily living that may affect Transfer Assistance: None risk of falls: Patient Identification Verified: Yes Hospitalized since last visit: No Secondary Verification Process Completed: Yes Has Dressing in Place as Prescribed: Yes Patient Requires Transmission-Based Precautions: No Has Compression in Place as Prescribed: Yes Patient Has Alerts: Yes Pain Present Now: Yes Patient Alerts: Not Diabetic Electronic Signature(s) Signed: 07/28/2022 4:12:51 PM By: Angelina Pih Entered By: Angelina Pih on 07/28/2022 09:55:01 -------------------------------------------------------------------------------- Clinic Level of Care Assessment Details Patient Name: Date of Service: [name] [name], [place] 07/28/2022 9:45 A M Medical Record Number: [id number] Patient Account Number: [id number] Date of Birth/Sex: Treating RN: 03-Jan-1961 (61 y.o. Esmeralda Links Primary Care Helmuth Recupero: Leonia Reader, Barbara Cower Other Clinician: Referring Suzanna Zahn: Treating Branch Pacitti/Extender: Thornell Mule in Treatment: 2 Clinic Level of Care Assessment Items TOOL 4 Quantity Score []  - 0 Use when only an EandM is performed on FOLLOW-UP visit ASSESSMENTS - Nursing Assessment /  Reassessment X- 1 10 Reassessment of Co-morbidities (includes updates in patient status) X- 1 5 Reassessment of Adherence to Treatment Plan ASSESSMENTS - Wound and Skin A ssessment / Reassessment []  - 0 Simple Wound Assessment / Reassessment - one wound [name] [name] ([id number]) [id number] Page 2 of 10 []  - 0 Complex Wound Assessment / Reassessment - multiple wounds []  - 0 Dermatologic / Skin Assessment (not related to wound area) ASSESSMENTS - Focused Assessment X- 1 5 Circumferential Edema Measurements - multi extremities []  - 0 Nutritional Assessment / Counseling / Intervention []  - 0 Lower Extremity Assessment (monofilament, tuning fork, pulses) []  - 0 Peripheral Arterial Disease Assessment (using hand held doppler) ASSESSMENTS - Ostomy and/or Continence Assessment and Care []  - 0 Incontinence Assessment and Management []  - 0 Ostomy Care Assessment and Management (repouching, etc.) PROCESS - Coordination of Care X - Simple Patient / Family Education for ongoing care 1 15 []  - 0 Complex (extensive) Patient / Family Education for ongoing care X- 1 10 Staff obtains Chiropractor, Records, T Results / Process Orders est []  - 0 Staff telephones HHA, Nursing Homes / Clarify orders / etc []  - 0 Routine Transfer to another Facility (non-emergent condition) []  - 0 Routine Hospital Admission (non-emergent condition) []  - 0 New Admissions / Manufacturing engineer / Ordering NPWT Apligraf, etc. , []  - 0 Emergency Hospital Admission (emergent condition) X- 1 10 Simple Discharge Coordination []  - 0 Complex (extensive) Discharge Coordination PROCESS - Special Needs []  - 0 Pediatric / Minor Patient Management []  - 0 Isolation Patient Management []  - 0 Hearing / Language / Visual special needs []  - 0 Assessment of Community assistance (transportation, D/C planning, etc.) []  - 0 Additional assistance / Altered mentation []  - 0 Support Surface(s)  Assessment (bed, cushion, seat, etc.) INTERVENTIONS - Wound Cleansing / Measurement X - Simple Wound Cleansing - one wound 1 5 []  - 0 Complex Wound Cleansing - multiple wounds X- 1  5 Wound Imaging (photographs - any number of wounds) []  - 0 Wound Tracing (instead of photographs) []  - 0 Simple Wound Measurement - one wound []  - 0 Complex Wound Measurement - multiple wounds INTERVENTIONS - Wound Dressings X - Small Wound Dressing one or multiple wounds 1 10 []  - 0 Medium Wound Dressing one or multiple wounds []  - 0 Large Wound Dressing one or multiple wounds []  - 0 Application of Medications - topical []  - 0 Application of Medications - injection INTERVENTIONS - Miscellaneous []  - 0 External ear exam []  - 0 Specimen Collection (cultures, biopsies, blood, body fluids, etc.) []  - 0 Specimen(s) / Culture(s) sent or taken to Lab for analysis [name] [name] ([id number]) [id number] Page 3 of 10 []  - 0 Patient Transfer (multiple staff / Nurse, adult / Similar devices) []  - 0 Simple Staple / Suture removal (25 or less) []  - 0 Complex Staple / Suture removal (26 or more) []  - 0 Hypo / Hyperglycemic Management (close monitor of Blood Glucose) []  - 0 Ankle / Brachial Index (ABI) - do not check if billed separately X- 1 5 Vital Signs Has the patient been seen at the hospital within the last three years: Yes Total Score: 80 Level Of Care: New/Established - Level 3 Electronic Signature(s) Signed: 07/28/2022 4:12:51 PM By: Angelina Pih Entered By: Angelina Pih on 07/28/2022 12:42:53 -------------------------------------------------------------------------------- Encounter Discharge Information Details Patient Name: Date of Service: [name] Dell, [place] RY 07/28/2022 9:45 A M Medical Record Number: [id number] Patient Account Number: [id number] Date of Birth/Sex: Treating RN: 1960/10/16 (61 y.o. Esmeralda Links Primary Care Sherrilyn Nairn: Leonia Reader, Barbara Cower Other  Clinician: Referring Lynita Groseclose: Treating Briseidy Spark/Extender: Thornell Mule in Treatment: 2 Encounter Discharge Information Items Discharge Condition: Stable Ambulatory Status: Ambulatory Discharge Destination: Home Transportation: Private Auto Accompanied By: self Schedule Follow-up Appointment: Yes Clinical Summary of Care: Electronic Signature(s) Signed: 07/28/2022 12:43:53 PM By: Angelina Pih Entered By: Angelina Pih on 07/28/2022 12:43:53 -------------------------------------------------------------------------------- Lower Extremity Assessment Details Patient Name: Date of Service: [name] [name], [place] 07/28/2022 9:45 A M Medical Record Number: [id number] Patient Account Number: [id number] Date of Birth/Sex: Treating RN: [birth year] (61 y.o. Esmeralda Links Primary Care Morissa Obeirne: Leonia Reader, Barbara Cower Other Clinician: Chales [name] ([id number]) [id number] Page 4 of 10 Referring Yilin Weedon: Treating Abbey Veith/Extender: Thornell Mule in Treatment: 2 Edema Assessment Assessed: [Left: No] [Right: No] Edema: [Left: Ye] [Right: s] Calf Left: Right: Point of Measurement: 34 cm From Medial Instep 48.5 cm Ankle Left: Right: Point of Measurement: 12 cm From Medial Instep 32.8 cm Vascular Assessment Pulses: Dorsalis Pedis Palpable: [Left:Yes] Electronic Signature(s) Signed: 07/28/2022 4:12:51 PM By: Angelina Pih Entered By: Angelina Pih on 07/28/2022 10:04:25 -------------------------------------------------------------------------------- Multi Wound Chart Details Patient Name: Date of Service: [name] Dell, MA RY 07/28/2022 9:45 A M Medical Record Number: [id number] Patient Account Number: [id number] Date of Birth/Sex: Treating RN: 09-Apr-1960 (61 y.o. Esmeralda Links Primary Care Amarilis Belflower: Leonia Reader, Barbara Cower Other Clinician: Referring Kang Ishida: Treating Elmo Shumard/Extender: Babs Bertin Weeks in  Treatment: 2 Vital Signs Height(in): 69 Pulse(bpm): 74 Weight(lbs): 254 Blood Pressure(mmHg): 136/81 Body Mass Index(BMI): 37.5 Temperature(F): 97.5 Respiratory Rate(breaths/min): 18 [1:Photos:] [N/A:N/A] Left, Circumferential Lower Leg N/A N/A Wound Location: Gradually Appeared N/A N/A Wounding Event: Cellulitis N/A N/A Primary Etiology: Hypertension N/A N/A Comorbid History: 06/18/2019 N/A N/A Date Acquired: 2 N/A N/A Tania Ade of TreatmentChales [name] ([id number]) [id number] Page 5 of 10 Healed - Epithelialized N/A N/A Wound Status: No N/A N/A Wound Recurrence: Yes  N/A N/A Clustered Wound: 0x0x0 N/A N/A Measurements L x W x D (cm) 0 N/A N/A A (cm) : rea 0 N/A N/A Volume (cm) : 100.00% N/A N/A % Reduction in Area: 100.00% N/A N/A % Reduction in Volume: Full Thickness Without Exposed N/A N/A Classification: Support Structures Large N/A N/A Exudate Amount: Serous N/A N/A Exudate Type: amber N/A N/A Exudate Color: Flat and Intact N/A N/A Wound Margin: Large (67-100%) N/A N/A Granulation Amount: Red, Pink N/A N/A Granulation Quality: None Present (0%) N/A N/A Necrotic Amount: Fat Layer (Subcutaneous Tissue): Yes N/A N/A Exposed Structures: Fascia: No Tendon: No Muscle: No Joint: No Bone: No Large (67-100%) N/A N/A Epithelialization: Treatment Notes Electronic Signature(s) Signed: 07/28/2022 12:41:08 PM By: Angelina Pih Previous Signature: 07/28/2022 11:11:53 AM Version By: Angelina Pih Entered By: Angelina Pih on 07/28/2022 12:41:08 -------------------------------------------------------------------------------- Multi-Disciplinary Care Plan Details Patient Name: Date of Service: [name] Dell, MA RY 07/28/2022 9:45 A M Medical Record Number: [id number] Patient Account Number: [id number] Date of Birth/Sex: Treating RN: March 08, 1960 (61 y.o. Esmeralda Links Primary Care Osiris Odriscoll: Leonia Reader, Barbara Cower Other  Clinician: Referring Yazmina Pareja: Treating Jakerria Kingbird/Extender: Thornell Mule in Treatment: 2 Active Inactive Soft Tissue Infection Nursing Diagnoses: Impaired tissue integrity Potential for infection: soft tissue Goals: Patient/caregiver will verbalize understanding of or measures to prevent infection and contamination in the home setting Date Initiated: 07/08/2022 Target Resolution Date: 08/07/2022 Goal Status: Active Patient's soft tissue infection will resolve Date Initiated: 07/08/2022 Target Resolution Date: 08/07/2022 Goal Status: Active Signs and symptoms of infection will be recognized early to allow for prompt treatment Date Initiated: 07/08/2022 Target Resolution Date: 08/07/2022 Goal Status: Active Interventions: Assess signs and symptoms of infection every visit YOLANDO, GILLUM ([id number]) [id number] Page 6 of 10 Provide education on infection Treatment Activities: Education provided on Infection : 07/22/2022 Systemic antibiotics : 07/08/2022 Notes: Wound/Skin Impairment Nursing Diagnoses: Impaired tissue integrity Knowledge deficit related to smoking impact on wound healing Knowledge deficit related to ulceration/compromised skin integrity Goals: Patient will demonstrate a reduced rate of smoking or cessation of smoking Date Initiated: 07/08/2022 Target Resolution Date: 08/07/2022 Goal Status: Active Patient/caregiver will verbalize understanding of skin care regimen Date Initiated: 07/08/2022 Target Resolution Date: 08/07/2022 Goal Status: Active Ulcer/skin breakdown will have a volume reduction of 30% by week 4 Date Initiated: 07/08/2022 Target Resolution Date: 08/07/2022 Goal Status: Active Ulcer/skin breakdown will have a volume reduction of 50% by week 8 Date Initiated: 07/08/2022 Target Resolution Date: 09/07/2022 Goal Status: Active Interventions: Assess patient/caregiver ability to obtain necessary supplies Assess  patient/caregiver ability to perform ulcer/skin care regimen upon admission and as needed Assess ulceration(s) every visit Provide education on smoking Provide education on ulcer and skin care Treatment Activities: Referred to DME Cianni Manny for dressing supplies : 07/08/2022 Skin care regimen initiated : 07/08/2022 Notes: Electronic Signature(s) Signed: 07/28/2022 12:43:12 PM By: Angelina Pih Entered By: Angelina Pih on 07/28/2022 12:43:11 -------------------------------------------------------------------------------- Non-Wound Condition Assessment Details Patient Name: Date of Service: [name] [name], [place] 07/28/2022 9:45 A M Medical Record Number: [id number] Patient Account Number: [id number] Date of Birth/Sex: Treating RN: 12/23/1960 (61 y.o. Esmeralda Links Primary Care Cove Haydon: Leonia Reader, Barbara Cower Other Clinician: Referring Neka Bise: Treating Avian Konigsberg/Extender: Babs Bertin Weeks in Treatment: 2 Non-Wound Condition: Condition: Cellulitis Location: Leg Side: Notes: 26x27x0.1 LILYGRACE, RODICK ([id number]) [phone].pdf Page 7 of 10 Photos Electronic Signature(s) Signed: 07/28/2022 4:12:51 PM By: Angelina Pih Entered By: Angelina Pih on 07/28/2022 10:43:50 -------------------------------------------------------------------------------- Pain Assessment Details Patient Name: Date of Service: [name] Dell, MA  RY 07/28/2022 9:45 A M Medical Record Number: [id number] Patient Account Number: [id number] Date of Birth/Sex: Treating RN: [birth year] (62 y.o. Esmeralda Links Primary Care Murdis Flitton: Leonia Reader, Barbara Cower Other Clinician: Referring Jshawn Hurta: Treating Shontel Santee/Extender: Babs Bertin Weeks in Treatment: 2 Active Problems Location of Pain Severity and Description of Pain Patient Has Paino Yes Site Locations Rate the pain. Current Pain Level: 2 Pain Management and Medication Current Pain Management: Electronic  Signature(s) Signed: 07/28/2022 4:12:51 PM By: Angelina Pih Entered By: Angelina Pih on 07/28/2022 09:56:44 [name] [name] ([id number]) [id number] Page 8 of 10 -------------------------------------------------------------------------------- Patient/Caregiver Education Details Patient Name: Date of Service: [name] [name], [place] 7/11/2024andnbsp9:45 A M Medical Record Number: [id number] Patient Account Number: [id number] Date of Birth/Gender: Treating RN: 1960-07-05 (62 y.o. Esmeralda Links Primary Care Physician: Leonia Reader, Barbara Cower Other Clinician: Referring Physician: Treating Physician/Extender: Thornell Mule in Treatment: 2 Education Assessment Education Provided To: Patient Education Topics Provided Wound/Skin Impairment: Handouts: Caring for Your Ulcer Methods: Explain/Verbal Responses: State content correctly Electronic Signature(s) Signed: 07/28/2022 4:12:51 PM By: Angelina Pih Entered By: Angelina Pih on 07/28/2022 12:43:21 -------------------------------------------------------------------------------- Wound Assessment Details Patient Name: Date of Service: [name] [name], [name] 07/28/2022 9:45 A M Medical Record Number: [id number] Patient Account Number: [id number] Date of Birth/Sex: Treating RN: May 06, 1960 (61 y.o. Esmeralda Links Primary Care Darby Shadwick: Leonia Reader, Barbara Cower Other Clinician: Referring Rondia Higginbotham: Treating Lenny Bouchillon/Extender: Babs Bertin Weeks in Treatment: 2 Wound Status Wound Number: 1 Primary Etiology: Cellulitis Wound Location: Left, Circumferential Lower Leg Wound Status: Healed - Epithelialized Wounding Event: Gradually Appeared Comorbid History: Hypertension Date Acquired: 06/18/2019 Weeks Of Treatment: 2 Clustered Wound: Yes Photos [place], Corrie [name] ([id number]) [id number] Page 9 of 10 Wound Measurements Length: (cm) Width: (cm) Depth: (cm) Area: (cm) Volume:  (cm) 0 % Reduction in Area: 100% 0 % Reduction in Volume: 100% 0 Epithelialization: Large (67-100%) 0 Tunneling: No 0 Undermining: No Wound Description Classification: Full Thickness Without Exposed Suppor Wound Margin: Flat and Intact Exudate Amount: Large Exudate Type: Serous Exudate Color: amber t Structures Foul Odor After Cleansing: No Slough/Fibrino Yes Wound Bed Granulation Amount: Large (67-100%) Exposed Structure Granulation Quality: Red, Pink Fascia Exposed: No Necrotic Amount: None Present (0%) Fat Layer (Subcutaneous Tissue) Exposed: Yes Tendon Exposed: No Muscle Exposed: No Joint Exposed: No Bone Exposed: No Treatment Notes Wound #1 (Lower Leg) Wound Laterality: Left, Circumferential Cleanser Peri-Wound Care Topical Primary Dressing Secondary Dressing Secured With Compression Wrap Compression Stockings Add-Ons Electronic Signature(s) Signed: 07/28/2022 4:12:51 PM By: Angelina Pih Entered By: Angelina Pih on 07/28/2022 10:42:59 Vitals Details -------------------------------------------------------------------------------- [name] [name] ([id number]) [id number] Page 10 of 10 Patient Name: Date of Service: [name] [name], [place] 07/28/2022 9:45 A M Medical Record Number: [id number] Patient Account Number: [id number] Date of Birth/Sex: Treating RN: [birth year] (62 y.o. Esmeralda Links Primary Care Mykelle Cockerell: Leonia Reader, Barbara Cower Other Clinician: Referring Ravyn Nikkel: Treating Elia Keenum/Extender: Thornell Mule in Treatment: 2 Vital Signs Time Taken: 09:55 Temperature (F): 97.5 Height (in): 69 Pulse (bpm): 74 Weight (lbs): 254 Respiratory Rate (breaths/min): 18 Body Mass Index (BMI): 37.5 Blood Pressure (mmHg): 136/81 Reference Range: 80 - 120 mg / dl Electronic Signature(s) Signed: 07/28/2022 4:12:51 PM By: Angelina Pih Entered By: Angelina Pih on 07/28/2022 09:56:36

## 2022-07-28 NOTE — Progress Notes (Signed)
NAME: [name] [name]  DOB: 1960-02-17  MRN: [id number]  Date/Time: 07/28/2022 11:30 AM   Subjective:   ?pt here for the visit by herself.  She is referred to me by the wound clinic PA. Bevan Vu is a 62 y.o. female with a history of high lipidemia, current smoker presents with left leg swelling and erythematous skin changes for the past 2 months Patient is followed at the wound clinic and was given Bactrim for 2 weeks and followed by doxycycline for cellulitis diagnosis of the left lower extremity.  As per patient there is no improvement in the skin changes. She is she is having nausea from the doxycycline She has no fever or chills. Patient states in 2019 she had blistering rash on her left leg and was treated as cellulitis in the hospital.  She was in the hospital for 3 days. 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 She has got cats and dogs at home She is a smoker 1 pack a day   Past Medical History:  Diagnosis Date   Anxiety    on meds   Cellulitis of left lower extremity 06/21/2017   Hyperlipidemia 06/21/2017   on meds   Hypertension    on meds   Osteoarthritis     Past Surgical History:  Procedure Laterality Date   LAPAROSCOPIC CHOLECYSTECTOMY  2010   TUBAL LIGATION  1986    Social History   Socioeconomic History   Marital status: Married    Spouse name: Not on file   Number of children: Not on file   Years of education: Not on file   Highest education level: Not on file  Occupational History   Not on file  Tobacco Use   Smoking status: Every Day    Current packs/day: 1.00    Average packs/day: 1 pack/day for 40.0 years (40.0 ttl pk-yrs)    Types: Cigarettes   Smokeless tobacco: Never  Vaping Use   Vaping status: Never Used  Substance and Sexual Activity   Alcohol use: Not Currently   Drug use: Never   Sexual activity: Not Currently  Other Topics Concern   Not on file  Social History Narrative   Not on file   Social Determinants of Health    Financial Resource Strain: Not on file  Food Insecurity: Not on file  Transportation Needs: Not on file  Physical Activity: Not on file  Stress: Not on file  Social Connections: Not on file  Intimate Partner Violence: Not on file    Family History  Problem Relation Age of Onset   Diabetes Mother    Liver disease Mother 80       NAFLD-   Colon polyps Father 44   Colon cancer Father 16   Colon polyps Brother 22   Esophageal cancer Neg Hx    Rectal cancer Neg Hx    Stomach cancer Neg Hx    No Known Allergies I? Current Outpatient Medications  Medication Sig Dispense Refill   ALPRAZolam (XANAX) 0.5 MG tablet Take 1 tablet (0.5 mg total) by mouth daily as needed for anxiety. 30 tablet 2   Ascorbic Acid (VITAMIN C PO) Take 1 tablet by mouth daily at 6 (six) AM.     atenolol (TENORMIN) 25 MG tablet TAKE 1 TABLET BY MOUTH EVERY DAY 90 tablet 1   atorvastatin (LIPITOR) 80 MG tablet TAKE 1 TABLET BY MOUTH EVERYDAY AT BEDTIME 90 tablet 1   busPIRone (BUSPAR) 30 MG tablet TAKE 1 TABLET  BY MOUTH 2 TIMES DAILY. 180 tablet 0   celecoxib (CELEBREX) 200 MG capsule TAKE 1 CAPSULE BY MOUTH EVERY DAY 90 capsule 3   doxycycline (VIBRAMYCIN) 100 MG capsule Take 1 capsule (100 mg total) by mouth 2 (two) times daily. 20 capsule 0   escitalopram (LEXAPRO) 10 MG tablet TAKE 1 TABLET BY MOUTH EVERY DAY 90 tablet 0   furosemide (LASIX) 20 MG tablet TAKE 1 TABLET BY MOUTH EVERY DAY AS DIRECTED 90 tablet 1   Multiple Vitamins-Minerals (ZINC PO) Take 1 tablet by mouth daily at 6 (six) AM.     rOPINIRole (REQUIP) 0.25 MG tablet TAKE 2 TABLETS (0.5 MG TOTAL) BY MOUTH AT BEDTIME. TAKE 1 TAB NIGHTLY FOR 3 DAYS, THEN GO UP TO 2 TABS NIGHTLY 180 tablet 1   traMADol (ULTRAM) 50 MG tablet Take 1 tablet (50 mg total) by mouth every 8 (eight) hours as needed. 15 tablet 0   VITAMIN D PO Take 1 tablet by mouth daily at 6 (six) AM.     No current facility-administered medications for this visit.     Abtx:   Anti-infectives (From admission, onward)    None       REVIEW OF SYSTEMS:  Const: negative fever, negative chills, negative weight loss Eyes: negative diplopia or visual changes, negative eye pain ENT: negative coryza, negative sore throat Resp: negative cough, hemoptysis, dyspnea Cards: negative for chest pain, palpitations, lower extremity edema GU: negative for frequency, dysuria and hematuria GI: Negative for abdominal pain, diarrhea, bleeding, constipation Skin: negative for rash and pruritus Heme: negative for easy bruising and gum/nose bleeding MS: negative for myalgias, arthralgias, back pain and muscle weakness Neurolo:negative for headaches, dizziness, vertigo, memory problems  Psych: negative for feelings of anxiety, depression  Endocrine: negative for thyroid, diabetes Allergy/Immunology- negative for any medication or food allergies ?  Objective:  VITALS:  BP (!) 153/85   Pulse 73   Temp (!) 96.4 F (35.8 C) (Temporal)   Ht 5\' 9"  (1.753 m)   Wt 244 lb (110.7 kg)   BMI 36.03 kg/m   PHYSICAL EXAM:  General: Alert, cooperative, no distress, appears stated age.  Increased BMI Head: Normocephalic, without obvious abnormality, atraumatic. Eyes: Conjunctivae clear, anicteric sclerae. Pupils are equal ENT Nares normal. No drainage or sinus tenderness. Lips, mucosa, and tongue normal. No Thrush Neck: Supple, symmetrical, no adenopathy, thyroid: non tender no carotid bruit and no JVD. Back: No CVA tenderness. Lungs: Clear to auscultation bilaterally. No Wheezing or Rhonchi. No rales. Heart: Regular rate and rhythm, no murmur, rub or gallop. Abdomen: Did not examine Extremities: Left leg edematous There is an erythematous well-demarcated skin area which is shiny Not much of discharge There are some scaling on the posterior aspect as well as in the foot     Skin: No rashes or lesions. Or bruising Lymph: Cervical, supraclavicular normal. Neurologic: Grossly  non-focal Pertinent Labs None available  Microbiology: None available  ? Impression/Recommendation 62 year old female presenting with 57-month history of left leg swelling and erythematous lesion She has venous edema along with venous stasis dermatitis Possibly there is contact dermatitis This is atypical for cellulitis Very clear-cut margins  She has not responded to Bactrim or doxycycline Doxycycline is causing a nausea Asked to stop antibiotics Will do baseline labs including ESR CRP TSH ANA, CBC and CMP Took a culture from the skin area Referral given for dermatology She needs compression dressing Will follow-up with her with all the labs Discussed with the patient in detail  note:  This document was prepared using Dragon voice recognition software and may include unintentional dictation errors.

## 2022-07-29 LAB — AEROBIC CULTURE W GRAM STAIN (SUPERFICIAL SPECIMEN): Gram Stain: NONE SEEN

## 2022-07-29 LAB — ANA: Anti Nuclear Antibody (ANA): NEGATIVE

## 2022-07-30 LAB — AEROBIC CULTURE W GRAM STAIN (SUPERFICIAL SPECIMEN)

## 2022-07-31 LAB — AEROBIC CULTURE W GRAM STAIN (SUPERFICIAL SPECIMEN)

## 2022-08-01 ENCOUNTER — Ambulatory Visit: Payer: Commercial Managed Care - PPO | Admitting: Internal Medicine

## 2022-08-01 ENCOUNTER — Encounter: Payer: Self-pay | Admitting: Internal Medicine

## 2022-08-01 VITALS — BP 150/82 | HR 97 | Temp 98.4°F | Resp 18 | Ht 69.0 in | Wt 243.2 lb

## 2022-08-01 DIAGNOSIS — R946 Abnormal results of thyroid function studies: Secondary | ICD-10-CM

## 2022-08-01 DIAGNOSIS — F17210 Nicotine dependence, cigarettes, uncomplicated: Secondary | ICD-10-CM

## 2022-08-01 DIAGNOSIS — M5030 Other cervical disc degeneration, unspecified cervical region: Secondary | ICD-10-CM | POA: Insufficient documentation

## 2022-08-01 DIAGNOSIS — Z6835 Body mass index (BMI) 35.0-35.9, adult: Secondary | ICD-10-CM

## 2022-08-01 DIAGNOSIS — M5136 Other intervertebral disc degeneration, lumbar region: Secondary | ICD-10-CM

## 2022-08-01 DIAGNOSIS — R7303 Prediabetes: Secondary | ICD-10-CM | POA: Insufficient documentation

## 2022-08-01 DIAGNOSIS — I83892 Varicose veins of left lower extremities with other complications: Secondary | ICD-10-CM

## 2022-08-01 DIAGNOSIS — F331 Major depressive disorder, recurrent, moderate: Secondary | ICD-10-CM | POA: Insufficient documentation

## 2022-08-01 DIAGNOSIS — G959 Disease of spinal cord, unspecified: Secondary | ICD-10-CM | POA: Insufficient documentation

## 2022-08-01 DIAGNOSIS — I83029 Varicose veins of left lower extremity with ulcer of unspecified site: Secondary | ICD-10-CM | POA: Diagnosis not present

## 2022-08-01 DIAGNOSIS — I872 Venous insufficiency (chronic) (peripheral): Secondary | ICD-10-CM | POA: Diagnosis not present

## 2022-08-01 DIAGNOSIS — R600 Localized edema: Secondary | ICD-10-CM

## 2022-08-01 DIAGNOSIS — F411 Generalized anxiety disorder: Secondary | ICD-10-CM | POA: Insufficient documentation

## 2022-08-01 DIAGNOSIS — E7800 Pure hypercholesterolemia, unspecified: Secondary | ICD-10-CM | POA: Insufficient documentation

## 2022-08-01 DIAGNOSIS — L97929 Non-pressure chronic ulcer of unspecified part of left lower leg with unspecified severity: Secondary | ICD-10-CM

## 2022-08-01 DIAGNOSIS — Z0000 Encounter for general adult medical examination without abnormal findings: Secondary | ICD-10-CM | POA: Diagnosis not present

## 2022-08-01 DIAGNOSIS — I10 Essential (primary) hypertension: Secondary | ICD-10-CM | POA: Diagnosis not present

## 2022-08-01 NOTE — Progress Notes (Addendum)
Office Visit  Subjective   Patient ID: [name] [name]   DOB: 04-Oct-1960   Age: 62 y.o.   MRN: [id number]   Chief Complaint Chief Complaint  Patient presents with   Annual Exam    Fasting     History of Present Illness [name] [name]. Feller is a 62 year old Caucasian/White female who presents for her annual health maintenance exam. She is due for the following health maintenance studies: mammogram, visual exam, and screening labs. This patient's past medical history Anxiety Disorder, Generalized, Depression, Hypertension, and Osteoarthritis.   Her last eye exam was in 04/2018 and she states her vision is doing ok. Her last mammogram was >10 years ago where we arranged one last arranged last year but she did not go.   There is no family history of breast cancer but her father did pass away from colon cancer around the age of 59. We referred her for a colonoscopy which was done on 06/05/2020 and this showed a polyp. They recommend for her to return for a repeat colonoscopy in 3 years. Her last pap smear was >10 years. She does not want anymore PAP screens. She does not exercise regularly. The patient does continue to smoke about a ppd. She started at age 89 yoa. She denies any chronic cough, SOB, wheezing, hemoptysis or any other problems. She does get yearly flu vaccines. She has not had any pneumonia or shingles vaccine. She had 5 COVID-19 vaccines including 3 boosters. There is no family history of CAD or stroke. She is not on an ASA.   The patient is a 62 year old Caucasian/White female who returns for a follow-up visit for her prediabetes. Since the last visit, there have been no problems. She remains on no medications; the diabetes is being managed by dietary intervention alone. She is not walking as much as they would like. She specifically denies unexplained abdominal pain, nausea or vomiting. She does not routinely check blood sugars. She came in fasting today in anticipation of lab work.   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  This occurred several months where I saw her on 07/01/2022.  She was covering this area with bandages and wrapping it with an ACE bandage.  HI saw her in 02/2022 where she was noted to have chronic venous insufficiency with worsening lower extremity edema and venous stasis ulcer of her left ankle/lower leg and some on the posterior part of her right leg.  She was trying to use compression hose but it did not help.  She had prior swelling the month before  that resolved but she has noted that her left leg is always worse than her right leg.  We did obtain a venous doppler US of her left leg on 03/09/2021 and this was negative for DVT.  Her swelling has come back and she has also noted increased varicose veins.  She purchased an [employer] and this area did heal.  I did refer her to the wound care clinic where her last visit was on 07/28/2022.  They referred her to infectious disease clinic on 07/28/2022 where she was noted by ID where she has taken 2 courses of bactrim and one course of doxycycline which they asked her to stop.  ID obtained cultures of this wound and she is waiting to hear back on the results.  The patient is a 62 yo female  who returns today for routine followup on her cholesterol. In 2022, her ASCVD score was 10.7 at that time so I asked her to start on pravastatin.  On her last visit, her cholesterol was still not controlled and we changed her pravastatin to Lipitor 80mg  qhs.  She also states her brother and mother had fatty liver disease.  Overall, she states she is doing well and is without any complaints or problems at this time. She specifically denies abdominal pain, nausea, vomiting, diarrhea, myalgias, and fatigue. She remains on dietary management as well as the following cholesterol lowering medications Lipitor 80mg  daily. She is fasting in anticipation for labs  today.  The patient is a 62 year old Caucasian/White female who presents for a follow-up evaluation of hypertension.  In 2021, we did start her on lisinopril due to an elevated BP.  The patient developed a worsening cough and she stopped the lisinopril on her own.  We therefore started her on Bystolic and HCTZ.  Her insurance would not cover her Bystolic so she is now on atenolol.  The patient does not check her blood pressure at home.   The patient's current medications include: atenolol 25mg  daily and Lasix 20mg  daily. The patient denies any headache, visual changes, dizziness, shortness of breath, orthopnea, and weakness/numbness. She reports there have been no other symptoms noted.  The patient also has a history of osteoarthritis that effects her cervical neck and lower back.  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  She is on glucosamine and turmeric but otherwise is not on any other medications for her back.  She was having some numbness of her right leg.  She had a MRI years ago but now she is stating she is having numbness of her right leg with some episodes of weakness.  There was and has been no loss of bowel/bladder function.  We sent her for a MRI of her lumbar and cervical spine which was done on 05/22/2021.  Her cervical spine showed fusion of the left facet joint at C2-C3 with degenerative changes at at facet C3-C4 with mild disc bulging with left foraminal stenosis that could compress the left C4 nerve.  She has spondylosis and facet osteoarthritis on the left at C4-C5 with bilateral foraminal narrowing that could affect her C5 nerve and mild edematous change of the facet joint on the left that could indicate painful arthritis.  Her lumbar showed no evidence of stenosis or neural compression.  There was chronic disc degeneration from L2-L3 through L5-S1  without compressive stenosis.  I had called [name] and went over her MRI of lumbar and cervical spine at that time.  We note the possible stenosis in her cervical spine but she denies any myleopathy down her arms/fingers.  She states she has some weakness of her right leg but there is no stenosis of her lumbar spine.  I offered to send her to physical therapy but she wants to just follow and if it worsens, she will come back to clinic.  There has been no change in her neck pain and she denies any weakness/numbness or pain down her arms.  In regards to her legs, she still has some numbness down her right leg but no weakness or pain down her legs.  She remains on Celebrex at this time and she states  it does help.  The patient also returns for followup of her anxiety and depression.  In 2022, she was started on Lexapro and buspar.  Today she states that both her depression and anxiety are moderate due to her healthy problems and her husband being in short term rehab.  She is currently on Lexapro 10mg  daily and Buspar 30mg  BID and Xanax as needed.  She states she does not really take the xanax. She denies any dizziness or side effects from her medications.  The symptoms have been present for years and are described as mild in severity. She reports no additional symptoms She denies suicidal ideation and homicidal ideation. This patient feels that she is able to care for herself and her dependents. 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 She also takes care of her 57 year old grandson.. She denies a recent life crisis. She currently lives with her family.         Past Medical History Past Medical History:  Diagnosis Date   Anxiety    on meds   Cellulitis of left lower extremity 06/21/2017   Hyperlipidemia 06/21/2017   on meds   Hypertension    on meds   Osteoarthritis      Allergies No Known Allergies    Medications  Current Outpatient Medications:    ALPRAZolam (XANAX) 0.5 MG tablet, Take 1 tablet (0.5 mg total) by mouth daily as needed for anxiety., Disp: 30 tablet, Rfl: 2   Ascorbic Acid (VITAMIN C PO), Take 1 tablet by mouth daily at 6 (six) AM., Disp: , Rfl:    atenolol (TENORMIN) 25 MG tablet, TAKE 1 TABLET BY MOUTH EVERY DAY, Disp: 90 tablet, Rfl: 1   atorvastatin (LIPITOR) 80 MG tablet, TAKE 1 TABLET BY MOUTH EVERYDAY AT BEDTIME, Disp: 90 tablet, Rfl: 1   busPIRone (BUSPAR) 30 MG tablet, TAKE 1 TABLET BY MOUTH TWICE A DAY, Disp: 180 tablet, Rfl: 0   celecoxib (CELEBREX) 200 MG capsule, TAKE 1 CAPSULE BY MOUTH EVERY DAY, Disp: 90 capsule, Rfl: 3   clobetasol ointment (TEMOVATE) 0.05 %, daily with bandage changes. Avoid applying to face, groin, and axilla., Disp: 60 g, Rfl: 2   escitalopram (LEXAPRO) 10 MG tablet, TAKE 1 TABLET BY MOUTH EVERY DAY, Disp: 90 tablet, Rfl: 0   furosemide (LASIX) 20 MG tablet, TAKE 1 TABLET BY MOUTH EVERY DAY AS DIRECTED, Disp: 90 tablet, Rfl: 1   levothyroxine (SYNTHROID) 25 MCG tablet, Take 1 tablet (25 mcg total) by mouth daily before breakfast., Disp: 90 tablet, Rfl: 0   Multiple Vitamins-Minerals (ZINC PO), Take 1 tablet by mouth daily at 6 (six) AM., Disp: , Rfl:    mupirocin ointment (BACTROBAN) 2 %, Apply to leg once daily with bandage change, Disp: 22 g, Rfl: 2   rOPINIRole (REQUIP) 0.25 MG tablet, TAKE 2 TABLETS (0.5 MG TOTAL) BY MOUTH AT BEDTIME. TAKE 1 TAB NIGHTLY FOR 3 DAYS, THEN GO UP TO 2 TABS NIGHTLY, Disp: 180 tablet, Rfl: 1   traMADol (ULTRAM) 50 MG tablet, Take 1 tablet (50 mg total) by mouth every 8 (eight) hours as needed., Disp: 15 tablet, Rfl: 0   VITAMIN D PO, Take 1 tablet by mouth daily at 6 (six) AM., Disp: , Rfl:   Current Facility-Administered Medications:    levothyroxine (SYNTHROID) tablet 12.5 mcg, 12.5 mcg, Oral, Q0600, Crist Fat, MD   Review of Systems Review of  Systems  Constitutional:  Negative for chills, fever  and malaise/fatigue.  Eyes:  Negative for blurred vision and double vision.  Respiratory:  Negative for cough, hemoptysis, shortness of breath and wheezing.   Cardiovascular:  Negative for chest pain, palpitations and leg swelling.  Gastrointestinal:  Negative for abdominal pain, blood in stool, constipation, diarrhea, heartburn, melena, nausea and vomiting.  Genitourinary:  Negative for hematuria.  Musculoskeletal:  Positive for back pain and neck pain. Negative for myalgias.  Skin:  Negative for itching and rash.  Neurological:  Negative for dizziness, focal weakness and headaches.  Endo/Heme/Allergies:  Negative for polydipsia.  Psychiatric/Behavioral:  Negative for memory loss and suicidal ideas.        Objective:    Vitals BP (!) 150/82 (BP Location: Left Arm, Patient Position: Sitting, Cuff Size: Normal)   Pulse 97   Temp 98.4 F (36.9 C) (Temporal)   Resp 18   Ht 5\' 9"  (1.753 m)   Wt 243 lb 3.2 oz (110.3 kg)   SpO2 99%   BMI 35.91 kg/m    Physical Examination Physical Exam Constitutional:      Appearance: Normal appearance. She is not ill-appearing.  HENT:     Head: Normocephalic and atraumatic.     Right Ear: Tympanic membrane, ear canal and external ear normal.     Left Ear: Tympanic membrane, ear canal and external ear normal.     Nose: Nose normal. No congestion or rhinorrhea.     Mouth/Throat:     Mouth: Mucous membranes are moist.     Pharynx: No oropharyngeal exudate or posterior oropharyngeal erythema.  Eyes:     General: No scleral icterus.    Conjunctiva/sclera: Conjunctivae normal.     Pupils: Pupils are equal, round, and reactive to light.  Neck:     Vascular: No carotid bruit.  Cardiovascular:     Rate and Rhythm: Normal rate and regular rhythm.     Pulses: Normal pulses.     Heart sounds: No murmur heard.    No friction rub. No gallop.  Pulmonary:     Effort: Pulmonary effort is normal. No respiratory distress.     Breath sounds: No wheezing,  rhonchi or rales.  Abdominal:     General: Bowel sounds are normal. There is no distension.     Palpations: Abdomen is soft.     Tenderness: There is no abdominal tenderness.  Musculoskeletal:     Cervical back: Neck supple. No tenderness.     Right lower leg: No edema.     Left lower leg: No edema.     Comments: Her left leg is wrapped in bandage from ankle to the leg.  Lymphadenopathy:     Cervical: No cervical adenopathy.  Skin:    General: Skin is warm and dry.     Findings: No rash.  Neurological:     General: No focal deficit present.     Mental Status: She is alert and oriented to person, place, and time.  Psychiatric:        Mood and Affect: Mood normal.        Behavior: Behavior normal.        Assessment & Plan:   No problem-specific Assessment & Plan notes found for this encounter.    No follow-ups on file.   Crist Fat, MD

## 2022-08-02 LAB — LIPID PANEL
Chol/HDL Ratio: 2.3 ratio (ref 0.0–4.4)
Cholesterol, Total: 147 mg/dL (ref 100–199)
HDL: 63 mg/dL (ref >39)
LDL Chol Calc (NIH): 69 mg/dL (ref 0–99)
Triglycerides: 76 mg/dL (ref 0–149)
VLDL Cholesterol Cal: 15 mg/dL (ref 5–40)

## 2022-08-02 LAB — TSH: TSH: 3.53 u[IU]/mL (ref 0.450–4.500)

## 2022-08-02 LAB — T4, FREE: Free T4: 0.67 ng/dL — ABNORMAL LOW (ref 0.82–1.77)

## 2022-08-03 ENCOUNTER — Telehealth: Payer: Self-pay

## 2022-08-03 NOTE — Telephone Encounter (Signed)
-----   Message from Lynn Ito sent at 08/03/2022 11:23 AM EDT ----- Can you let her know that all her tests pretty much came back fine- the culture I took on;ly had skin bacteria and no organisms that would cause cellulitis. Thx- Does she have an appt with dermatology? ----- Message ----- From: Interface, [facility] [place] Sent: 07/28/2022  12:50 PM EDT To: Lynn Ito, MD

## 2022-08-05 ENCOUNTER — Ambulatory Visit: Payer: Commercial Managed Care - PPO | Admitting: Physician Assistant

## 2022-08-08 ENCOUNTER — Ambulatory Visit: Payer: Commercial Managed Care - PPO | Admitting: Dermatology

## 2022-08-10 ENCOUNTER — Encounter: Payer: Self-pay | Admitting: Dermatology

## 2022-08-10 ENCOUNTER — Ambulatory Visit (INDEPENDENT_AMBULATORY_CARE_PROVIDER_SITE_OTHER): Payer: Commercial Managed Care - PPO | Admitting: Dermatology

## 2022-08-10 VITALS — BP 128/66 | HR 77 | Temp 98.1°F

## 2022-08-10 DIAGNOSIS — I872 Venous insufficiency (chronic) (peripheral): Secondary | ICD-10-CM

## 2022-08-10 DIAGNOSIS — A46 Erysipelas: Secondary | ICD-10-CM | POA: Diagnosis not present

## 2022-08-10 MED ORDER — CEFDINIR 300 MG PO CAPS
300.0000 mg | ORAL_CAPSULE | Freq: Two times a day (BID) | ORAL | 0 refills | Status: AC
Start: 2022-08-10 — End: 2022-08-20

## 2022-08-10 MED ORDER — MUPIROCIN 2 % EX OINT
TOPICAL_OINTMENT | CUTANEOUS | 2 refills | Status: DC
Start: 2022-08-10 — End: 2023-02-24

## 2022-08-10 NOTE — Progress Notes (Signed)
New Patient Visit   Subjective  [name] [name] is a 62 y.o. female who presents for the following: rash on leg. Dur: 2 months. Was being followed by wound care clinic. Uses compression for maintenance of venous stasis. Developed worsening redness and swelling in the left lower leg. It is very tender. Yellow fluid leaks out of some areas. Patient is unsure what caused the flare. The redness is spreading around the leg and is newly affecting the posterior surface. Dx with cellulitis, was given 2 weeks of Bactrim followed by Doxycycline, which causes nausea and GI upset. Hx of chronic venous stasis. Did not improve. wound care clinic referred her to ID who did a culture. Culture grew bacteria that were considered normal flora. ID then referred patient to dermatology.  A venous doppler US of her left leg on 03/09/2021 was negative for DVT   PMH: Dx with cellulitis in 2019, had large bullae. Was hospitalized. Improved with IV Vancomycin. +MRSA per patient.    The following portions of the chart were reviewed this encounter and updated as appropriate: medications, allergies, medical history  This patient is accompanied in the office by her  husband .   Review of Systems:  No other skin or systemic complaints except as noted in HPI or Assessment and Plan.  Objective  Well appearing patient in no apparent distress; mood and affect are within normal limits.  A focused examination was performed of the following areas: Left leg  Relevant exam findings are noted in the Assessment and Plan. Vitals:   08/10/22 1459  BP: 128/66  Pulse: 77  Temp: 98.1 F (36.7 C)    Left Lower Leg - Anterior Well-demarcated deeply erythematous edematous confluent plaque with serous drainage, maceration, and erosion on left lower leg, anterolateral surfaces are more involved than posterior surface Pitting edema of left dorsal foot           Assessment & Plan     Erysipelas  Related  Medications mupirocin ointment (BACTROBAN) 2 % Apply to leg once daily with bandage change  cefdinir (OMNICEF) 300 MG capsule Take 1 capsule (300 mg total) by mouth 2 (two) times daily for 10 days.  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  Recommend daily graduated compression hose/stockings- easiest to put on first thing in morning, remove at bedtime.  Elevate legs as much as possible. Avoid salt/sodium rich foods.  Sharply demarcated erythema on lower leg in the setting of venous stasis raises concern for erysipelas. Discussed that biopsy and tissue culture have low yield for erysipelas (and cellulitis), and that we would risk post-procedure bleeding and poor wound healing. Patient has a history of a venous ulcer and is susceptible to ulcers with biopsies. Thus, jointly decided to treat empirically. Patient saw Dr Rivka Safer with ID on 7/11 who performed a surface swab culture. Culture shows Coag-neg Staph that is resistant to doxycycline and bactrim, and Corynebacteria. Spoke to Dr Rivka Safer by phone during visit, who reassured that the bacteria on culture would not cause erysipelas and are not pathogenic. She further shared that if we are concerned for erysipelas, cephalexin would be recommended to cover for Streptococcus species. Appreciate Dr Lynne Logan time and input. Discussed with patient that cephalexin is dosed four times per day. Patient expressed that this would be difficult to do, so we  decided on cefdinir given its twice daily dosing.  Start Mupirocin ointment daily with each bandage change. May sting on open areas  Start Cefdinir 300 mg twice daily for 10 days. Risk of allergic reaction/rash, nausea, diarrhea. Per Dr Rivka Safer (on 7/25 after the visit), cefadroxil would have  better bioavailability and is another option if concern for erysipelas persists.  Goal is to rule out infection and restart compression as tolerated  Will also consider starting pentoxifylline if no contraindications (recent cerebral or retinal hemorrhage)  Patient will send MyChart update in 1 week. Will forward message to Dr. Katrinka Blazing.   cefdinir (OMNICEF) 300 MG capsule - Left Lower Leg - Anterior Take 1 capsule (300 mg total) by mouth 2 (two) times daily for 10 days.    Return in about 2 weeks (around 08/24/2022) for Rash Follow Up.  I, Lawson Radar, CMA, am acting as scribe for Elie Goody, MD.   Documentation: I have reviewed the above documentation for accuracy and completeness, and I agree with the above.  Elie Goody, MD

## 2022-08-10 NOTE — Patient Instructions (Addendum)
Start Mupirocin ointment daily with each bandage change.   Start Cefdinir 300 mg twice daily for 10 days.   Send MyChart update in 1 week.   Recommend [facility] Hypochlorous Spray (found in the wound care section) OR Cln brand Acne or Sports wash. The [facility] Hypochlorous Spray can be sprayed on daily and left on. The Cln wash should be applied to the affected area daily for at least 30 seconds and then rinsed off. If you are using clindamycin solution or lotion or another topical antibiotic to treat acne, using a hypochlorous product may help lower the risk of antibiotic resistant bacteria.    Due to recent changes in healthcare laws, you may see results of your pathology and/or laboratory studies on MyChart before the doctors have had a chance to review them. We understand that in some cases there may be results that are confusing or concerning to you. Please understand that not all results are received at the same time and often the doctors may need to interpret multiple results in order to provide you with the best plan of care or course of treatment. Therefore, we ask that you please give [place] 2 business days to thoroughly review all your results before contacting the office for clarification. Should we see a critical lab result, you will be contacted sooner.   If You Need Anything After Your Visit  If you have any questions or concerns for your doctor, please call our main line at [phone] and press option 4 to reach your doctor's medical assistant. If no one answers, please leave a voicemail as directed and we will return your call as soon as possible. Messages left after 4 pm will be answered the following business day.   You may also send [place] a message via MyChart. We typically respond to MyChart messages within 1-2 business days.  For prescription refills, please ask your pharmacy to contact our office. Our fax number is [phone].  If you have an urgent issue when the clinic is  closed that cannot wait until the next business day, you can page your doctor at the number below.    Please note that while we do our best to be available for urgent issues outside of office hours, we are not available 24/7.   If you have an urgent issue and are unable to reach [place], you may choose to seek medical care at your doctor's office, retail clinic, urgent care center, or emergency room.  If you have a medical emergency, please immediately call 911 or go to the emergency department.  Pager Numbers  - Dr. Gwen Pounds: [phone]  - Dr. Neale Burly: [phone]  - Dr. Roseanne Reno: [phone]  In the event of inclement weather, please call our main line at [phone] for an update on the status of any delays or closures.  Dermatology Medication Tips: Please keep the boxes that topical medications come in in order to help keep track of the instructions about where and how to use these. Pharmacies typically print the medication instructions only on the boxes and not directly on the medication tubes.   If your medication is too expensive, please contact our office at [phone] option 4 or send [place] a message through MyChart.   We are unable to tell what your co-pay for medications will be in advance as this is different depending on your insurance coverage. However, we may be able to find a substitute medication at lower cost or fill out paperwork to get insurance to cover a  needed medication.   If a prior authorization is required to get your medication covered by your insurance company, please allow [place] 1-2 business days to complete this process.  Drug prices often vary depending on where the prescription is filled and some pharmacies may offer cheaper prices.  The website www.goodrx.com contains coupons for medications through different pharmacies. The prices here do not account for what the cost may be with help from insurance (it may be cheaper with your insurance), but the website can  give you the price if you did not use any insurance.  - You can print the associated coupon and take it with your prescription to the pharmacy.  - You may also stop by our office during regular business hours and pick up a GoodRx coupon card.  - If you need your prescription sent electronically to a different pharmacy, notify our office through [facility] or by phone at [phone] option 4.     Si Usted Necesita Algo Despus de Su Visita  Tambin puede enviarnos un mensaje a travs de Clinical cytogeneticist. Por lo general respondemos a los mensajes de MyChart en el transcurso de 1 a 2 das hbiles.  Para renovar recetas, por favor pida a su farmacia que se ponga en contacto con nuestra oficina. Annie Sable de fax es [place] [phone].  Si tiene un asunto urgente cuando la clnica est cerrada y que no puede esperar hasta el siguiente da hbil, puede llamar/localizar a su doctor(a) al nmero que aparece a continuacin.   Por favor, tenga en cuenta que aunque hacemos todo lo posible para estar disponibles para asuntos urgentes fuera del horario de [place], no estamos disponibles las 24 horas del da, los 7 [address]  [address] de la [place].   Si tiene un problema urgente y no puede comunicarse con nosotros, puede optar por buscar atencin mdica  en el consultorio de su doctor(a), en una clnica privada, en un centro de atencin urgente o en una sala de emergencias.  Si tiene Engineer, drilling, por favor llame inmediatamente al 911 o vaya a la sala de emergencias.  Nmeros de bper  - Dr. Gwen Pounds: [phone]  - Dra. Moye: [phone]  - Dra. Roseanne Reno: [phone]  En caso de inclemencias del [place], por favor llame a Lacy Duverney principal al [phone] para una actualizacin sobre el [place] de cualquier retraso o cierre.  Consejos para la medicacin en dermatologa: Por favor, guarde las cajas en las que vienen los medicamentos de uso tpico para ayudarle a seguir las instrucciones sobre  dnde y cmo usarlos. Las farmacias generalmente imprimen las instrucciones del medicamento slo en las cajas y no directamente en los tubos del [place].   Si su medicamento es muy caro, por favor, pngase en contacto con Rolm Gala llamando al [phone] y presione la opcin 4 o envenos un mensaje a travs de Clinical cytogeneticist.   No podemos decirle cul ser su copago por los medicamentos por adelantado ya que esto es diferente dependiendo de la cobertura de su seguro. Sin embargo, es posible que podamos encontrar un medicamento sustituto a Audiological scientist un formulario para que el seguro cubra el medicamento que se considera necesario.   Si se requiere una autorizacin previa para que su compaa de seguros [place] su medicamento, por favor permtanos de 1 a 2 das hbiles para completar [address]  Los precios de los medicamentos varan con frecuencia dependiendo del Environmental consultant de dnde se surte la receta y alguna farmacias pueden ofrecer precios ms baratos.  El sitio web www.goodrx.com tiene cupones para medicamentos de Health and safety inspector. Los precios aqu no tienen en cuenta lo que podra costar con la ayuda del seguro (puede ser ms barato con su seguro), pero el sitio web puede darle el precio si no utiliz Tourist information centre manager.  - Puede imprimir el cupn correspondiente y llevarlo con su receta a la farmacia.  - Tambin puede pasar por nuestra oficina durante el horario de atencin regular y Education officer, museum una tarjeta de cupones de GoodRx.  - Si necesita que su receta se enve electrnicamente a una farmacia diferente, informe a nuestra oficina a travs de MyChart de [place] o por telfono llamando al [phone] y presione la opcin 4.

## 2022-08-11 ENCOUNTER — Telehealth: Payer: Self-pay | Admitting: Infectious Diseases

## 2022-08-11 NOTE — Telephone Encounter (Signed)
I saw patient on 07/28/22 for a chronic left leg wound  She had been on 2 rounds of antibiotics bactrim and doxy with no help When I saw her  on 7/11 I wondered whether this was stasis dermatitis- I took a superficial culture to r/o any bacteria of significance like staphylococcus  aureus or sterptococcus or gram negative and they were negative- it showe dcoag neg staph and corynebacterium both skin bacteria and just contaminants and not the cause of her problem- I referred to [facility] Yesterday She saw Dermatologist Dr.Smith . He called me about treating the 2 organisms with Iv antibiotic as he was concerned that she may have erysipelas   . I explained to him they are regular skin bacteria found on any skin surface and as this was a superficial swab they are not pathogens to cause erysipelas and she also does not have any systemic infection or any concern for endocarditis He wanted to wrap the leg for stasis dermatitis but concerned about erysipelas.  told him he can do cephalexin ( keflex) if he was concerned about erysipelas

## 2022-08-16 ENCOUNTER — Encounter: Payer: Self-pay | Admitting: Dermatology

## 2022-08-16 NOTE — Progress Notes (Signed)
Her labs look good but her thyroid function was a bit off. She needs to come in the next 2-3 weeks for a TSH, Free T4 and Free T3.  Pt notified

## 2022-08-24 ENCOUNTER — Ambulatory Visit (INDEPENDENT_AMBULATORY_CARE_PROVIDER_SITE_OTHER): Payer: Commercial Managed Care - PPO | Admitting: Dermatology

## 2022-08-24 ENCOUNTER — Encounter: Payer: Self-pay | Admitting: Dermatology

## 2022-08-24 VITALS — BP 125/66 | HR 80

## 2022-08-24 DIAGNOSIS — I872 Venous insufficiency (chronic) (peripheral): Secondary | ICD-10-CM

## 2022-08-24 DIAGNOSIS — A46 Erysipelas: Secondary | ICD-10-CM

## 2022-08-24 MED ORDER — CLOBETASOL PROPIONATE 0.05 % EX OINT: TOPICAL_OINTMENT | CUTANEOUS | 2 refills | Status: DC

## 2022-08-24 NOTE — Progress Notes (Signed)
   New Patient Visit   Subjective  [name] [name] is a 62 y.o. female who presents for the following: 2 week recheck rash on leg. Finished Cefdinir as directed. Stopped Mupirocin due to stinging her skin. Has been using Vaseline Jelly under the wrap. Has not started Vashe wash yet. Not using compression stockings as consistently as advised.  States leg is not as swollen as before  The following portions of the chart were reviewed this encounter and updated as appropriate: medications, allergies, medical history   Review of Systems:  No other skin or systemic complaints except as noted in HPI or Assessment and Plan.  Objective  Well appearing patient in no apparent distress; mood and affect are within normal limits.  A focused examination was performed of the following areas: Left leg  Relevant exam findings are noted in the Assessment and Plan.       Assessment & Plan    Suspected left lower leg erysipelas, improved s/p cefdinir and brief use of mupirocin   Related Medications Stop Mupirocin given burning Start antibacterial Vashe wash as directed. Between bandage changes.    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 Redness and scaling have improved. New ulceration on left lateral aspect. 3+ pitting edema. Serous drainage from multiple erosions.    Stasis in the legs causes chronic leg swelling, which may result in itchy or painful rashes, skin discoloration, skin texture changes, and sometimes ulceration.  Recommend daily graduated compression hose/stockings- easiest to put on first thing in morning, remove at bedtime.  Elevate legs as much as possible. Avoid salt/sodium rich foods.   Treatment: Patient reports less use of compression due  to life stressors (husband is ill with UTI/delirium) and working. Corresponds with continued severe edema of bilateral lower extremities. Discussed that lack of compression will allow edema to worsen. Chronic edema causes skin inflammation and breakdown. May have component of lymphedema Start clobetasol 0.05% ointment daily with bandage changes. Avoid applying to face, groin, and axilla. Use as directed. Long-term use can cause thinning of the skin. Wear compression stockings daily. 30 to 40 mmHg of pressure  Consider seeing wound care if ulceration worsens  Topical steroids (such as triamcinolone, fluocinolone, fluocinonide, mometasone, clobetasol, halobetasol, betamethasone, hydrocortisone) can cause thinning and lightening of the skin if they are used for too long in the same area. Your physician has selected the right strength medicine for your problem and area affected on the body. Please use your medication only as directed by your physician to prevent side effects.     Cleansed with Puracyn today. Petroleum Jelly and non-stick telfa applied. Coban wrap applied over telfa.   Return in about 2 weeks (around 09/07/2022) for Rash Follow Up.  I, Lawson Radar, CMA, am acting as scribe for Elie Goody, MD.   Documentation: I have reviewed the above documentation for accuracy and completeness, and I agree with the above.  Elie Goody, MD

## 2022-08-24 NOTE — Patient Instructions (Addendum)
Start Vashe wash as directed. Between bandage changes.   Wear compression stockings daily. 30 to 40 mmHg of pressure.  Start clobetasol 0.05% ointment daily with bandage changes. Avoid applying to face, groin, and axilla. Use as directed. Long-term use can cause thinning of the skin.  Topical steroids (such as triamcinolone, fluocinolone, fluocinonide, mometasone, clobetasol, halobetasol, betamethasone, hydrocortisone) can cause thinning and lightening of the skin if they are used for too long in the same area. Your physician has selected the right strength medicine for your problem and area affected on the body. Please use your medication only as directed by your physician to prevent side effects.     Stasis in the legs causes chronic leg swelling, which may result in itchy or painful rashes, skin discoloration, skin texture changes, and sometimes ulceration.  Recommend daily graduated compression hose/stockings- easiest to put on first thing in morning, remove at bedtime.  Elevate legs as much as possible. Avoid salt/sodium rich foods.    Due to recent changes in healthcare laws, you may see results of your pathology and/or laboratory studies on MyChart before the doctors have had a chance to review them. We understand that in some cases there may be results that are confusing or concerning to you. Please understand that not all results are received at the same time and often the doctors may need to interpret multiple results in order to provide you with the best plan of care or course of treatment. Therefore, we ask that you please give [place] 2 business days to thoroughly review all your results before contacting the office for clarification. Should we see a critical lab result, you will be contacted sooner.   If You Need Anything After Your Visit  If you have any questions or concerns for your doctor, please call our main line at [phone] and press option 4 to reach your doctor's medical  assistant. If no one answers, please leave a voicemail as directed and we will return your call as soon as possible. Messages left after 4 pm will be answered the following business day.   You may also send [place] a message via MyChart. We typically respond to MyChart messages within 1-2 business days.  For prescription refills, please ask your pharmacy to contact our office. Our fax number is [phone].  If you have an urgent issue when the clinic is closed that cannot wait until the next business day, you can page your doctor at the number below.    Please note that while we do our best to be available for urgent issues outside of office hours, we are not available 24/7.   If you have an urgent issue and are unable to reach [place], you may choose to seek medical care at your doctor's office, retail clinic, urgent care center, or emergency room.  If you have a medical emergency, please immediately call 911 or go to the emergency department.  Pager Numbers  - Dr. Gwen Pounds: [phone]  - Dr. Roseanne Reno: [phone]  In the event of inclement weather, please call our main line at [phone] for an update on the status of any delays or closures.  Dermatology Medication Tips: Please keep the boxes that topical medications come in in order to help keep track of the instructions about where and how to use these. Pharmacies typically print the medication instructions only on the boxes and not directly on the medication tubes.   If your medication is too expensive, please contact our office at [phone] option 4  or send [place] a message through MyChart.   We are unable to tell what your co-pay for medications will be in advance as this is different depending on your insurance coverage. However, we may be able to find a substitute medication at lower cost or fill out paperwork to get insurance to cover a needed medication.   If a prior authorization is required to get your medication covered by your  insurance company, please allow [place] 1-2 business days to complete this process.  Drug prices often vary depending on where the prescription is filled and some pharmacies may offer cheaper prices.  The website www.goodrx.com contains coupons for medications through different pharmacies. The prices here do not account for what the cost may be with help from insurance (it may be cheaper with your insurance), but the website can give you the price if you did not use any insurance.  - You can print the associated coupon and take it with your prescription to the pharmacy.  - You may also stop by our office during regular business hours and pick up a GoodRx coupon card.  - If you need your prescription sent electronically to a different pharmacy, notify our office through [facility] or by phone at [phone] option 4.     Si Usted Necesita Algo Despus de Su Visita  Tambin puede enviarnos un mensaje a travs de Clinical cytogeneticist. Por lo general respondemos a los mensajes de MyChart en el transcurso de 1 a 2 das hbiles.  Para renovar recetas, por favor pida a su farmacia que se ponga en contacto con nuestra oficina. Annie Sable de fax es [place] [phone].  Si tiene un asunto urgente cuando la clnica est cerrada y que no puede esperar hasta el siguiente da hbil, puede llamar/localizar a su doctor(a) al nmero que aparece a continuacin.   Por favor, tenga en cuenta que aunque hacemos todo lo posible para estar disponibles para asuntos urgentes fuera del horario de [place], no estamos disponibles las 24 horas del da, los 7 [address]  [address] de la [place].   Si tiene un problema urgente y no puede comunicarse con nosotros, puede optar por buscar atencin mdica  en el consultorio de su doctor(a), en una clnica privada, en un centro de atencin urgente o en una sala de emergencias.  Si tiene Engineer, drilling, por favor llame inmediatamente al 911 o vaya a la sala de emergencias.  Nmeros de  bper  - Dr. Gwen Pounds: [phone]  - Dra. Roseanne Reno: [phone]  En caso de inclemencias del [place], por favor llame a Lacy Duverney principal al [phone] para una actualizacin sobre el [place] de cualquier retraso o cierre.  Consejos para la medicacin en dermatologa: Por favor, guarde las cajas en las que vienen los medicamentos de uso tpico para ayudarle a seguir las instrucciones sobre dnde y cmo usarlos. Las farmacias generalmente imprimen las instrucciones del medicamento slo en las cajas y no directamente en los tubos del [place].   Si su medicamento es muy caro, por favor, pngase en contacto con Rolm Gala llamando al [phone] y presione la opcin 4 o envenos un mensaje a travs de Clinical cytogeneticist.   No podemos decirle cul ser su copago por los medicamentos por adelantado ya que esto es diferente dependiendo de la cobertura de su seguro. Sin embargo, es posible que podamos encontrar un medicamento sustituto a Audiological scientist un formulario para que el seguro cubra el medicamento que se considera necesario.   Si se requiere Futures trader  previa para que su compaa de seguros [place] su medicamento, por favor permtanos de 1 a 2 das hbiles para completar [address]  Los precios de los medicamentos varan con frecuencia dependiendo del Environmental consultant de dnde se surte la receta y alguna farmacias pueden ofrecer precios ms baratos.  El sitio web www.goodrx.com tiene cupones para medicamentos de Health and safety inspector. Los precios aqu no tienen en cuenta lo que podra costar con la ayuda del seguro (puede ser ms barato con su seguro), pero el sitio web puede darle el precio si no utiliz Tourist information centre manager.  - Puede imprimir el cupn correspondiente y llevarlo con su receta a la farmacia.  - Tambin puede pasar por nuestra oficina durante el horario de atencin regular y Education officer, museum una tarjeta de cupones de GoodRx.  - Si necesita que su receta se enve electrnicamente a una  farmacia diferente, informe a nuestra oficina a travs de MyChart de [place] o por telfono llamando al [phone] y presione la opcin 4.

## 2022-08-31 ENCOUNTER — Other Ambulatory Visit: Payer: Self-pay | Admitting: Internal Medicine

## 2022-09-05 ENCOUNTER — Other Ambulatory Visit: Payer: Self-pay | Admitting: Internal Medicine

## 2022-09-05 MED ORDER — ALPRAZOLAM 0.5 MG PO TABS: 0.5000 mg | ORAL_TABLET | Freq: Every day | ORAL | 2 refills | Status: DC | PRN

## 2022-09-07 ENCOUNTER — Ambulatory Visit: Payer: Commercial Managed Care - PPO

## 2022-09-07 ENCOUNTER — Encounter: Payer: Self-pay | Admitting: Dermatology

## 2022-09-07 ENCOUNTER — Ambulatory Visit (INDEPENDENT_AMBULATORY_CARE_PROVIDER_SITE_OTHER): Payer: Commercial Managed Care - PPO | Admitting: Dermatology

## 2022-09-07 VITALS — BP 140/80

## 2022-09-07 DIAGNOSIS — R21 Rash and other nonspecific skin eruption: Secondary | ICD-10-CM

## 2022-09-07 DIAGNOSIS — R946 Abnormal results of thyroid function studies: Secondary | ICD-10-CM

## 2022-09-07 DIAGNOSIS — I878 Other specified disorders of veins: Secondary | ICD-10-CM

## 2022-09-07 DIAGNOSIS — I872 Venous insufficiency (chronic) (peripheral): Secondary | ICD-10-CM | POA: Diagnosis not present

## 2022-09-07 NOTE — Patient Instructions (Addendum)
For rash on arms, thighs, abdomen Start Clobetasol ointment 1 to 2 times a day affected areas until itchy rash clear, avoid face, groin, axilla Start Zyrtec 2 times a day  For Left lower leg Stop the Clobetasol ointment for now, if flares may restart Continue moisturizer daily Continue compression socks daily  Due to recent changes in healthcare laws, you may see results of your pathology and/or laboratory studies on MyChart before the doctors have had a chance to review them. We understand that in some cases there may be results that are confusing or concerning to you. Please understand that not all results are received at the same time and often the doctors may need to interpret multiple results in order to provide you with the best plan of care or course of treatment. Therefore, we ask that you please give [place] 2 business days to thoroughly review all your results before contacting the office for clarification. Should we see a critical lab result, you will be contacted sooner.   If You Need Anything After Your Visit  If you have any questions or concerns for your doctor, please call our main line at [phone] and press option 4 to reach your doctor's medical assistant. If no one answers, please leave a voicemail as directed and we will return your call as soon as possible. Messages left after 4 pm will be answered the following business day.   You may also send [place] a message via MyChart. We typically respond to MyChart messages within 1-2 business days.  For prescription refills, please ask your pharmacy to contact our office. Our fax number is [phone].  If you have an urgent issue when the clinic is closed that cannot wait until the next business day, you can page your doctor at the number below.    Please note that while we do our best to be available for urgent issues outside of office hours, we are not available 24/7.   If you have an urgent issue and are unable to reach [place], you  may choose to seek medical care at your doctor's office, retail clinic, urgent care center, or emergency room.  If you have a medical emergency, please immediately call 911 or go to the emergency department.  Pager Numbers  - Dr. Gwen Pounds: [phone]  - Dr. Roseanne Reno: [phone]  - Dr. Katrinka Blazing: [phone]   In the event of inclement weather, please call our main line at [phone] for an update on the status of any delays or closures.  Dermatology Medication Tips: Please keep the boxes that topical medications come in in order to help keep track of the instructions about where and how to use these. Pharmacies typically print the medication instructions only on the boxes and not directly on the medication tubes.   If your medication is too expensive, please contact our office at [phone] option 4 or send [place] a message through MyChart.   We are unable to tell what your co-pay for medications will be in advance as this is different depending on your insurance coverage. However, we may be able to find a substitute medication at lower cost or fill out paperwork to get insurance to cover a needed medication.   If a prior authorization is required to get your medication covered by your insurance company, please allow [place] 1-2 business days to complete this process.  Drug prices often vary depending on where the prescription is filled and some pharmacies may offer cheaper prices.  The website www.goodrx.com contains coupons  for medications through different pharmacies. The prices here do not account for what the cost may be with help from insurance (it may be cheaper with your insurance), but the website can give you the price if you did not use any insurance.  - You can print the associated coupon and take it with your prescription to the pharmacy.  - You may also stop by our office during regular business hours and pick up a GoodRx coupon card.  - If you need your prescription sent  electronically to a different pharmacy, notify our office through [facility] or by phone at [phone] option 4.     Si Usted Necesita Algo Despus de Su Visita  Tambin puede enviarnos un mensaje a travs de Clinical cytogeneticist. Por lo general respondemos a los mensajes de MyChart en el transcurso de 1 a 2 das hbiles.  Para renovar recetas, por favor pida a su farmacia que se ponga en contacto con nuestra oficina. Annie Sable de fax es [place] [phone].  Si tiene un asunto urgente cuando la clnica est cerrada y que no puede esperar hasta el siguiente da hbil, puede llamar/localizar a su doctor(a) al nmero que aparece a continuacin.   Por favor, tenga en cuenta que aunque hacemos todo lo posible para estar disponibles para asuntos urgentes fuera del horario de [place], no estamos disponibles las 24 horas del da, los 7 [address]  [address] de la [place].   Si tiene un problema urgente y no puede comunicarse con nosotros, puede optar por buscar atencin mdica  en el consultorio de su doctor(a), en una clnica privada, en un centro de atencin urgente o en una sala de emergencias.  Si tiene Engineer, drilling, por favor llame inmediatamente al 911 o vaya a la sala de emergencias.  Nmeros de bper  - Dr. Gwen Pounds: [phone]  - Dra. Roseanne Reno: [phone]  - Dr. Katrinka Blazing: [phone]   En caso de inclemencias del tiempo, por favor llame a Lacy Duverney principal al [phone] para una actualizacin sobre el [place] de cualquier retraso o cierre.  Consejos para la medicacin en dermatologa: Por favor, guarde las cajas en las que vienen los medicamentos de uso tpico para ayudarle a seguir las instrucciones sobre dnde y cmo usarlos. Las farmacias generalmente imprimen las instrucciones del medicamento slo en las cajas y no directamente en los tubos del [place].   Si su medicamento es muy caro, por favor, pngase en contacto con Rolm Gala llamando al [phone] y presione la  opcin 4 o envenos un mensaje a travs de Clinical cytogeneticist.   No podemos decirle cul ser su copago por los medicamentos por adelantado ya que esto es diferente dependiendo de la cobertura de su seguro. Sin embargo, es posible que podamos encontrar un medicamento sustituto a Audiological scientist un formulario para que el seguro cubra el medicamento que se considera necesario.   Si se requiere una autorizacin previa para que su compaa de seguros [place] su medicamento, por favor permtanos de 1 a 2 das hbiles para completar [address]  Los precios de los medicamentos varan con frecuencia dependiendo del Environmental consultant de dnde se surte la receta y alguna farmacias pueden ofrecer precios ms baratos.  El sitio web www.goodrx.com tiene cupones para medicamentos de Health and safety inspector. Los precios aqu no tienen en cuenta lo que podra costar con la ayuda del seguro (puede ser ms barato con su seguro), pero el sitio web puede darle el precio si no utiliz Tourist information centre manager.  - Puede imprimir el cupn  correspondiente y llevarlo con su receta a la farmacia.  - Tambin puede pasar por nuestra oficina durante el horario de atencin regular y Education officer, museum una tarjeta de cupones de GoodRx.  - Si necesita que su receta se enve electrnicamente a una farmacia diferente, informe a nuestra oficina a travs de MyChart de [place] o por telfono llamando al [phone] y presione la opcin 4.

## 2022-09-07 NOTE — Progress Notes (Signed)
   Follow-Up Visit   Subjective  [name] [name] is a 62 y.o. female who presents for the following: Stasis dermatitis, L lower leg, 2 wk f/u, Clobetasol oint qd, Vashe wash qd, compression socks qd, has had a hard time with the 30-40 mmHg compression hose sos using a lower strength, she did purchase some 30-40 mmHg zipper compression socks that she will start, improved, rash arms, legs abdomen, x 4 days, itchy no treatment The patient has spots, moles and lesions to be evaluated, some may be new or changing and the patient may have concern these could be cancer.  Patient accompanied by husband.  The following portions of the chart were reviewed this encounter and updated as appropriate: medications, allergies, medical history  Review of Systems:  No other skin or systemic complaints except as noted in HPI or Assessment and Plan.  Objective  Well appearing patient in no apparent distress; mood and affect are within normal limits.   A focused examination was performed of the following areas: Legs, arms  Relevant exam findings are noted in the Assessment and Plan.    Assessment & Plan   Chronic venous stasis dermatitis of left lower extremity, significantly improved with treatment L Lower leg Exam: well demarcated erythematous edematous plaque of left lower leg. Interval clearance of scaling, closure of ulcerations, reduction in erythema, reduction in tenderness. No drainage  Chronic and persistent condition with duration or expected duration over one year. Condition is improving with treatment but not currently at goal.   Stasis in the legs causes chronic leg swelling, which may result in itchy or painful rashes, skin discoloration, skin texture changes, and sometimes ulceration.  Recommend daily graduated compression hose/stockings- easiest to put on first thing in morning, remove at bedtime.  Elevate legs as much as possible. Avoid salt/sodium rich foods.  Treatment Plan: Cont  Moisturizer qd to leg Cont Compression socks/hose qd D/c Clobetasol ointment for now, may restart prn flares Vashe washes per patient preference  Rash Arms, R thigh, abdomen Exam: Erythematous edematous papules and excoriations scattered on arms, R ant thigh, abdomen.   Treatment Plan: Start Clobetasol oint qd/bid aa itchy arms, legs, abdomen, avoid f/g/a Start Zyrtec bid  Topical steroids (such as triamcinolone, fluocinolone, fluocinonide, mometasone, clobetasol, halobetasol, betamethasone, hydrocortisone) can cause thinning and lightening of the skin if they are used for too long in the same area. Your physician has selected the right strength medicine for your problem and area affected on the body. Please use your medication only as directed by your physician to prevent side effects.    Return if symptoms worsen or fail to improve.  I, Ardis Rowan, RMA, am acting as scribe for Elie Goody, MD .   Documentation: I have reviewed the above documentation for accuracy and completeness, and I agree with the above.  Elie Goody, MD

## 2022-09-08 LAB — T3, FREE: T3, Free: 2.7 pg/mL (ref 2.0–4.4)

## 2022-09-08 LAB — T4, FREE: Free T4: 0.71 ng/dL — ABNORMAL LOW (ref 0.82–1.77)

## 2022-09-08 LAB — TSH: TSH: 5.19 u[IU]/mL — ABNORMAL HIGH (ref 0.450–4.500)

## 2022-09-12 ENCOUNTER — Other Ambulatory Visit: Payer: Self-pay

## 2022-09-12 DIAGNOSIS — E0590 Thyrotoxicosis, unspecified without thyrotoxic crisis or storm: Secondary | ICD-10-CM

## 2022-09-12 MED ORDER — LEVOTHYROXINE SODIUM 25 MCG PO TABS
12.5000 ug | ORAL_TABLET | Freq: Every day | ORAL | Status: DC
Start: 2022-09-13 — End: 2023-01-27

## 2022-09-12 NOTE — Progress Notes (Signed)
She has hypothyroidism where I want her to start levothyroxine po daily.  Patient notified

## 2022-09-12 NOTE — Progress Notes (Signed)
New RX

## 2022-09-13 ENCOUNTER — Other Ambulatory Visit: Payer: Self-pay

## 2022-09-13 MED ORDER — LEVOTHYROXINE SODIUM 25 MCG PO TABS: 25.0000 ug | ORAL_TABLET | Freq: Every day | ORAL | 0 refills | Status: DC

## 2022-09-13 NOTE — Progress Notes (Signed)
New Rx

## 2022-09-30 ENCOUNTER — Ambulatory Visit: Payer: Commercial Managed Care - PPO | Admitting: Internal Medicine

## 2022-10-06 ENCOUNTER — Ambulatory Visit: Payer: Commercial Managed Care - PPO | Admitting: Internal Medicine

## 2022-10-06 ENCOUNTER — Encounter: Payer: Self-pay | Admitting: Internal Medicine

## 2022-10-06 VITALS — BP 130/82 | HR 89 | Temp 97.6°F | Resp 18 | Ht 69.0 in | Wt 236.4 lb

## 2022-10-06 DIAGNOSIS — F411 Generalized anxiety disorder: Secondary | ICD-10-CM | POA: Diagnosis not present

## 2022-10-06 DIAGNOSIS — F331 Major depressive disorder, recurrent, moderate: Secondary | ICD-10-CM | POA: Diagnosis not present

## 2022-10-06 MED ORDER — DULOXETINE HCL 30 MG PO CPEP: 30.0000 mg | ORAL_CAPSULE | Freq: Every day | ORAL | 2 refills | Status: DC

## 2022-10-06 NOTE — Assessment & Plan Note (Signed)
Plan as above.  

## 2022-10-06 NOTE — Assessment & Plan Note (Signed)
We will continue on buspar and xanax as needed.  I am going to start her on cymbalta 30mg  daily and see her back in 6 weeks.  We will recheck her thryoid function at that time.

## 2022-10-06 NOTE — Progress Notes (Signed)
Office Visit  Subjective   Patient ID: [name] [name]   DOB: 1960-11-08   Age: 62 y.o.   MRN: [id number]   Chief Complaint Chief Complaint  Patient presents with   Acute Visit     History of Present Illness Mrs. Truglio is a 62 yo female who returns today to discuss her medications for her anxiety and depression.  In 2022, she was started on Lexapro and buspar.  She states that she stopped her lexapro about 2 weeks ago where she had her medicine refilled but does not know what happened to it.  She denies any side effects from coming off her lexapro.  She there is not much change in her depression and anxiety but she has a lot of problems at home with her husband's health.  She is wanting to switch to cymbalta for her depression and anxiety as she thinks it will help this as well as her pain from arthritis.   She has been on Lexapro 10mg  daily and Buspar 30mg  BID and Xanax as needed.  She states she does not really take the xanax. She denies any dizziness or side effects from her medications.  The symptoms have been present for years and are described as mild in severity. She reports no additional symptoms She denies suicidal ideation and homicidal ideation. This patient feels that she is able to care for herself and her dependents. 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 She also takes care of her 11 year old grandson.. She denies a recent life crisis. She currently lives with her family.      Past Medical History Past Medical History:  Diagnosis Date   Anxiety    on meds   Cellulitis of left lower extremity 06/21/2017   Hyperlipidemia 06/21/2017   on meds   Hypertension    on meds   Osteoarthritis      Allergies No Known Allergies   Medications  Current Outpatient Medications:    ALPRAZolam (XANAX) 0.5 MG tablet, Take 1 tablet (0.5 mg total) by mouth daily as needed for anxiety., Disp: 30  tablet, Rfl: 2   Ascorbic Acid (VITAMIN C PO), Take 1 tablet by mouth daily at 6 (six) AM., Disp: , Rfl:    atenolol (TENORMIN) 25 MG tablet, TAKE 1 TABLET BY MOUTH EVERY DAY, Disp: 90 tablet, Rfl: 1   atorvastatin (LIPITOR) 80 MG tablet, TAKE 1 TABLET BY MOUTH EVERYDAY AT BEDTIME, Disp: 90 tablet, Rfl: 1   busPIRone (BUSPAR) 30 MG tablet, TAKE 1 TABLET BY MOUTH TWICE A DAY, Disp: 180 tablet, Rfl: 0   celecoxib (CELEBREX) 200 MG capsule, TAKE 1 CAPSULE BY MOUTH EVERY DAY, Disp: 90 capsule, Rfl: 3   clobetasol ointment (TEMOVATE) 0.05 %, daily with bandage changes. Avoid applying to face, groin, and axilla., Disp: 60 g, Rfl: 2   furosemide (LASIX) 20 MG tablet, TAKE 1 TABLET BY MOUTH EVERY DAY AS DIRECTED, Disp: 90 tablet, Rfl: 1   levothyroxine (SYNTHROID) 25 MCG tablet, Take 1 tablet (25 mcg total) by mouth daily before breakfast., Disp: 90 tablet, Rfl: 0   Multiple Vitamins-Minerals (ZINC PO), Take 1 tablet by mouth daily at 6 (six) AM., Disp: , Rfl:    mupirocin ointment (BACTROBAN) 2 %, Apply to leg once daily with bandage change, Disp: 22 g, Rfl: 2   rOPINIRole (REQUIP) 0.25 MG tablet, TAKE 2 TABLETS (0.5 MG TOTAL) BY MOUTH  AT BEDTIME. TAKE 1 TAB NIGHTLY FOR 3 DAYS, THEN GO UP TO 2 TABS NIGHTLY, Disp: 180 tablet, Rfl: 1   traMADol (ULTRAM) 50 MG tablet, Take 1 tablet (50 mg total) by mouth every 8 (eight) hours as needed., Disp: 15 tablet, Rfl: 0   VITAMIN D PO, Take 1 tablet by mouth daily at 6 (six) AM., Disp: , Rfl:   Current Facility-Administered Medications:    levothyroxine (SYNTHROID) tablet 12.5 mcg, 12.5 mcg, Oral, Q0600, Crist Fat, MD   Review of Systems Review of Systems  Constitutional:  Negative for chills, fever and malaise/fatigue.  Respiratory:  Negative for cough and shortness of breath.   Cardiovascular:  Negative for chest pain, palpitations and leg swelling.  Gastrointestinal:  Negative for abdominal pain, constipation, diarrhea, nausea and vomiting.  Skin:   Positive for rash. Negative for itching.  Neurological:  Negative for dizziness, weakness and headaches.       Objective:    Vitals BP 130/82   Pulse 89   Temp 97.6 F (36.4 C)   Resp 18   Ht 5\' 9"  (1.753 m)   Wt 236 lb 6.4 oz (107.2 kg)   SpO2 99%   BMI 34.91 kg/m    Physical Examination Physical Exam Constitutional:      Appearance: Normal appearance. She is not ill-appearing.  Cardiovascular:     Rate and Rhythm: Normal rate and regular rhythm.     Pulses: Normal pulses.     Heart sounds: No murmur heard.    No friction rub. No gallop.  Pulmonary:     Effort: Pulmonary effort is normal. No respiratory distress.     Breath sounds: No wheezing, rhonchi or rales.  Abdominal:     General: Bowel sounds are normal. There is no distension.     Palpations: Abdomen is soft.     Tenderness: There is no abdominal tenderness.  Musculoskeletal:     Right lower leg: No edema.     Left lower leg: No edema.  Skin:    General: Skin is warm and dry.     Findings: No rash.  Neurological:     Mental Status: She is alert.        Assessment & Plan:   Moderate episode of recurrent major depressive disorder (HCC) We will continue on buspar and xanax as needed.  I am going to start her on cymbalta 30mg  daily and see her back in 6 weeks.  We will recheck her thryoid function at that time.  GAD (generalized anxiety disorder) Plan as above.    Return in about 6 weeks (around 11/17/2022).   Crist Fat, MD

## 2022-10-07 ENCOUNTER — Encounter: Payer: Self-pay | Admitting: Internal Medicine

## 2022-10-16 ENCOUNTER — Other Ambulatory Visit: Payer: Self-pay | Admitting: Internal Medicine

## 2022-10-28 ENCOUNTER — Other Ambulatory Visit: Payer: Self-pay | Admitting: Internal Medicine

## 2022-11-02 ENCOUNTER — Other Ambulatory Visit: Payer: Self-pay | Admitting: Internal Medicine

## 2022-11-02 DIAGNOSIS — R252 Cramp and spasm: Secondary | ICD-10-CM

## 2022-11-16 ENCOUNTER — Encounter: Payer: Self-pay | Admitting: Internal Medicine

## 2022-11-16 ENCOUNTER — Ambulatory Visit: Payer: Commercial Managed Care - PPO | Admitting: Internal Medicine

## 2022-11-16 VITALS — BP 126/82 | HR 106 | Temp 98.6°F | Resp 18 | Ht 69.0 in | Wt 237.4 lb

## 2022-11-16 DIAGNOSIS — E039 Hypothyroidism, unspecified: Secondary | ICD-10-CM

## 2022-11-16 DIAGNOSIS — F411 Generalized anxiety disorder: Secondary | ICD-10-CM | POA: Diagnosis not present

## 2022-11-16 DIAGNOSIS — F330 Major depressive disorder, recurrent, mild: Secondary | ICD-10-CM | POA: Diagnosis not present

## 2022-11-16 DIAGNOSIS — I10 Essential (primary) hypertension: Secondary | ICD-10-CM

## 2022-11-16 MED ORDER — DULOXETINE HCL 60 MG PO CPEP: 60.0000 mg | ORAL_CAPSULE | Freq: Every day | ORAL | 1 refills | Status: DC

## 2022-11-16 MED ORDER — TRAZODONE HCL 50 MG PO TABS: 25.0000 mg | ORAL_TABLET | Freq: Every day | ORAL | 1 refills | Status: DC

## 2022-11-16 NOTE — Assessment & Plan Note (Signed)
Plan as below.

## 2022-11-16 NOTE — Progress Notes (Signed)
Office Visit  Subjective   Patient ID: [name] [name]   DOB: 16-Jun-1960   Age: 62 y.o.   MRN: [id number]   Chief Complaint Chief Complaint  Patient presents with   Follow-up     History of Present Illness [name] [name] is a 62 yo female who returns today for followup of her anxiety and depression.  On her last visit, we did start her on cymbalta 30mg  daily.  She states this did help a lot but she had some increased anxiety about a week ago and started taking cymbalta 30mg  2 tabs (60mg ) in AM.  She has used xanax prn which she has only had to use maybe one time since her last visit.  In 2022, she was started on Lexapro and buspar.  She states that she stopped her lexapro about 2 months ago where she had her medicine refilled but does not know what happened to it.  She denies any side effects from coming off her lexapro.  She there is not much change in her depression and anxiety but she has a lot of problems at home with her husband's health.  She is wanting to switch to cymbalta for her depression and anxiety as she thinks it will help this as well as her pain from arthritis.   She has been on Lexapro 10mg  daily and Buspar 30mg  BID and Xanax as needed.  She denies any dizziness or side effects from her medications.  The symptoms have been present for years and are described as mild in severity. She reports no additional symptoms except for some insomnia.  She denies suicidal ideation and homicidal ideation. This patient feels that she is able to care for herself and her dependents. 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 She also takes care of her 8 year old grandson.. She denies a recent life crisis. She currently lives with her family.  The patient is a 62 year old female who returns for a regularly scheduled thyroid check. Since the last visit, 1there has been no overall change in her status. She is on  levothyroxine po daily.  She claims to have no symptoms suggestive of thyroid imbalance specifically denying fatigue, cold intolerance, heat intolerance, tremors, anxiety, unexplained weight changes, and insomnia.   The patient is a 62 year old Caucasian/White female who presents for a follow-up evaluation of hypertension.  In 2021, we did start her on lisinopril due to an elevated BP.  The patient developed a worsening cough and she stopped the lisinopril on her own.  We therefore started her on Bystolic and HCTZ.  Her insurance would not cover her Bystolic so she is now on atenolol.  The patient does not check her blood pressure at home.   The patient's current medications include: atenolol 25mg  daily and Lasix 20mg  daily. The patient denies any headache, visual changes, dizziness, shortness of breath, orthopnea, and weakness/numbness. She reports there have been no other symptoms noted.      Past Medical History Past Medical History:  Diagnosis Date   Anxiety    on meds   Cellulitis of left lower extremity 06/21/2017   Hyperlipidemia 06/21/2017   on meds   Hypertension    on meds   Osteoarthritis      Allergies No Known Allergies   Medications  Current Outpatient Medications:    ALPRAZolam (XANAX) 0.5 MG tablet, Take 1 tablet (0.5  mg total) by mouth daily as needed for anxiety., Disp: 30 tablet, Rfl: 2   Ascorbic Acid (VITAMIN C PO), Take 1 tablet by mouth daily at 6 (six) AM., Disp: , Rfl:    atenolol (TENORMIN) 25 MG tablet, TAKE 1 TABLET BY MOUTH EVERY DAY, Disp: 90 tablet, Rfl: 1   atorvastatin (LIPITOR) 80 MG tablet, TAKE 1 TABLET BY MOUTH EVERYDAY AT BEDTIME, Disp: 90 tablet, Rfl: 1   busPIRone (BUSPAR) 30 MG tablet, TAKE 1 TABLET BY MOUTH TWICE A DAY, Disp: 180 tablet, Rfl: 0   celecoxib (CELEBREX) 200 MG capsule, TAKE 1 CAPSULE BY MOUTH EVERY DAY, Disp: 90 capsule, Rfl: 3   clobetasol ointment (TEMOVATE) 0.05 %, daily with bandage changes. Avoid applying to face,  groin, and axilla., Disp: 60 g, Rfl: 2   furosemide (LASIX) 20 MG tablet, TAKE 1 TABLET BY MOUTH EVERY DAY AS DIRECTED, Disp: 90 tablet, Rfl: 1   levothyroxine (SYNTHROID) 25 MCG tablet, Take 1 tablet (25 mcg total) by mouth daily before breakfast., Disp: 90 tablet, Rfl: 0   Multiple Vitamins-Minerals (ZINC PO), Take 1 tablet by mouth daily at 6 (six) AM., Disp: , Rfl:    mupirocin ointment (BACTROBAN) 2 %, Apply to leg once daily with bandage change, Disp: 22 g, Rfl: 2   rOPINIRole (REQUIP) 0.25 MG tablet, TAKE 2 TABLETS BY MOUTH AT BEDTIME. TAKE 1 TAB NIGHTLY FOR 3 DAYS, THEN GO UP TO 2 TABS NIGHTLY, Disp: 180 tablet, Rfl: 1   traMADol (ULTRAM) 50 MG tablet, Take 1 tablet (50 mg total) by mouth every 8 (eight) hours as needed., Disp: 15 tablet, Rfl: 0   VITAMIN D PO, Take 1 tablet by mouth daily at 6 (six) AM., Disp: , Rfl:   Current Facility-Administered Medications:    levothyroxine (SYNTHROID) tablet 12.5 mcg, 12.5 mcg, Oral, Q0600, Crist Fat, MD   Review of Systems Review of Systems  Constitutional:  Negative for chills and fever.  Eyes:  Negative for blurred vision and double vision.  Respiratory:  Negative for shortness of breath.   Cardiovascular:  Negative for chest pain and palpitations.  Gastrointestinal:  Negative for abdominal pain, constipation, diarrhea, nausea and vomiting.  Genitourinary:  Negative for frequency.  Musculoskeletal:  Negative for myalgias.  Skin:  Negative for itching and rash.  Neurological:  Negative for dizziness, weakness and headaches.  Endo/Heme/Allergies:  Negative for polydipsia.       Objective:    Vitals BP 126/82   Pulse (!) 106   Temp 98.6 F (37 C)   Resp 18   Ht 5\' 9"  (1.753 m)   Wt 237 lb 6.4 oz (107.7 kg)   SpO2 (!) 18%   BMI 35.06 kg/m    Physical Examination Physical Exam Constitutional:      Appearance: Normal appearance. She is not ill-appearing.  Cardiovascular:     Rate and Rhythm: Normal rate and regular  rhythm.     Pulses: Normal pulses.     Heart sounds: No murmur heard.    No friction rub. No gallop.  Pulmonary:     Effort: Pulmonary effort is normal. No respiratory distress.     Breath sounds: No wheezing, rhonchi or rales.  Abdominal:     General: Bowel sounds are normal. There is no distension.     Palpations: Abdomen is soft.     Tenderness: There is no abdominal tenderness.  Musculoskeletal:     Right lower leg: No edema.     Left lower leg:  No edema.  Skin:    General: Skin is warm and dry.     Findings: No rash.  Neurological:     General: No focal deficit present.     Mental Status: She is alert and oriented to person, place, and time.  Psychiatric:        Mood and Affect: Mood normal.        Behavior: Behavior normal.        Assessment & Plan:   Essential hypertension Her BP is doing well.  We will cotninue her current meds and continue to follow.  GAD (generalized anxiety disorder) Plan as below.  Mild episode of recurrent major depressive disorder (HCC) Her depression and anxiety are doing better. She has mild recurrent major depression. I am going to increase her dose of cymbalta from 30mg  to 60mg  daily.  She is having some insomnia and we will start her on low dose trazodone.  Hypothyroidism We will recheck her TFT's today.    Return in about 3 months (around 02/16/2023).   Crist Fat, MD

## 2022-11-16 NOTE — Assessment & Plan Note (Signed)
Her BP is doing well.  We will cotninue her current meds and continue to follow.

## 2022-11-16 NOTE — Assessment & Plan Note (Signed)
We will recheck her TFT's today.

## 2022-11-16 NOTE — Assessment & Plan Note (Signed)
Her depression and anxiety are doing better. She has mild recurrent major depression. I am going to increase her dose of cymbalta from 30mg  to 60mg  daily.  She is having some insomnia and we will start her on low dose trazodone.

## 2022-11-17 LAB — TSH: TSH: 0.918 u[IU]/mL (ref 0.450–4.500)

## 2022-11-17 LAB — T4, FREE: Free T4: 0.98 ng/dL (ref 0.82–1.77)

## 2022-12-01 ENCOUNTER — Other Ambulatory Visit: Payer: Self-pay | Admitting: Internal Medicine

## 2022-12-09 ENCOUNTER — Other Ambulatory Visit: Payer: Self-pay | Admitting: Internal Medicine

## 2022-12-09 ENCOUNTER — Encounter: Payer: Self-pay | Admitting: Internal Medicine

## 2022-12-09 ENCOUNTER — Other Ambulatory Visit: Payer: Self-pay

## 2022-12-09 ENCOUNTER — Ambulatory Visit: Payer: Commercial Managed Care - PPO | Admitting: Internal Medicine

## 2022-12-09 VITALS — BP 122/70 | HR 84 | Temp 98.6°F | Resp 16 | Wt 234.0 lb

## 2022-12-09 DIAGNOSIS — R1011 Right upper quadrant pain: Secondary | ICD-10-CM | POA: Insufficient documentation

## 2022-12-09 LAB — POCT URINALYSIS DIPSTICK
Bilirubin, UA: NEGATIVE
Blood, UA: NEGATIVE
Glucose, UA: NEGATIVE
Ketones, UA: NEGATIVE
Leukocytes, UA: NEGATIVE
Nitrite, UA: NEGATIVE
Protein, UA: NEGATIVE
Spec Grav, UA: 1.015 (ref 1.010–1.025)
Urobilinogen, UA: 0.2 U/dL
pH, UA: 6 (ref 5.0–8.0)

## 2022-12-09 MED ORDER — PANTOPRAZOLE SODIUM 40 MG PO TBEC: 40.0000 mg | DELAYED_RELEASE_TABLET | Freq: Every day | ORAL | 2 refills | Status: DC

## 2022-12-09 MED ORDER — TRAZODONE HCL 50 MG PO TABS: 50.0000 mg | ORAL_TABLET | Freq: Every day | ORAL | 2 refills | Status: DC

## 2022-12-09 NOTE — Progress Notes (Signed)
Rx Refill

## 2022-12-09 NOTE — Progress Notes (Unsigned)
Office Visit  Subjective   Patient ID: [name] [name]   DOB: 1960/02/01   Age: 62 y.o.   MRN: [id number]   Chief Complaint Chief Complaint  Patient presents with   Acute Visit    Right abdomen pain x weeks ago. Bothers her when up and moving     History of Present Illness Nelson returns today with RUQ pain that began about a month ago.  She initially thought this was gas and has noticed it has worsened.  She states when she sits, it does not bother her but when she gets up and moves around she has a continuous dull aching pain in her right upper quadrant.  This is now occurring every day.  There is associated nausea but no fevers, chills, or vomiting.  She is having normal stools and no BRBPR, bile colored stools or melena.  She denies any reflux problems.  She has had her gallbladder removed in the past about 12 years ago.  There is no vaginal discharge, urinary frequency, hematuria or dysuria.     Past Medical History Past Medical History:  Diagnosis Date   Anxiety    on meds   Cellulitis of left lower extremity 06/21/2017   Hyperlipidemia 06/21/2017   on meds   Hypertension    on meds   Osteoarthritis      Allergies Not on File   Medications  Current Outpatient Medications:    ALPRAZolam (XANAX) 0.5 MG tablet, Take 1 tablet (0.5 mg total) by mouth daily as needed for anxiety., Disp: 30 tablet, Rfl: 2   Ascorbic Acid (VITAMIN C PO), Take 1 tablet by mouth daily at 6 (six) AM., Disp: , Rfl:    atenolol (TENORMIN) 25 MG tablet, TAKE 1 TABLET BY MOUTH EVERY DAY, Disp: 90 tablet, Rfl: 1   atorvastatin (LIPITOR) 80 MG tablet, TAKE 1 TABLET BY MOUTH EVERYDAY AT BEDTIME, Disp: 90 tablet, Rfl: 1   busPIRone (BUSPAR) 30 MG tablet, TAKE 1 TABLET BY MOUTH TWICE A DAY, Disp: 180 tablet, Rfl: 0   celecoxib (CELEBREX) 200 MG capsule, TAKE 1 CAPSULE BY MOUTH EVERY DAY, Disp: 90 capsule, Rfl: 3   clobetasol ointment (TEMOVATE) 0.05 %, daily with bandage changes. Avoid applying to face,  groin, and axilla., Disp: 60 g, Rfl: 2   DULoxetine (CYMBALTA) 60 MG capsule, Take 1 capsule (60 mg total) by mouth daily., Disp: 90 capsule, Rfl: 1   escitalopram (LEXAPRO) 10 MG tablet, TAKE 1 TABLET BY MOUTH EVERY DAY, Disp: 90 tablet, Rfl: 0   furosemide (LASIX) 20 MG tablet, TAKE 1 TABLET BY MOUTH EVERY DAY AS DIRECTED, Disp: 90 tablet, Rfl: 1   levothyroxine (SYNTHROID) 25 MCG tablet, TAKE 1 TABLET BY MOUTH DAILY BEFORE BREAKFAST., Disp: 90 tablet, Rfl: 0   Multiple Vitamins-Minerals (ZINC PO), Take 1 tablet by mouth daily at 6 (six) AM., Disp: , Rfl:    mupirocin ointment (BACTROBAN) 2 %, Apply to leg once daily with bandage change, Disp: 22 g, Rfl: 2   rOPINIRole (REQUIP) 0.25 MG tablet, TAKE 2 TABLETS BY MOUTH AT BEDTIME. TAKE 1 TAB NIGHTLY FOR 3 DAYS, THEN GO UP TO 2 TABS NIGHTLY, Disp: 180 tablet, Rfl: 1   traMADol (ULTRAM) 50 MG tablet, Take 1 tablet (50 mg total) by mouth every 8 (eight) hours as needed., Disp: 15 tablet, Rfl: 0   traZODone (DESYREL) 50 MG tablet, TAKE 0.5 TABLETS BY MOUTH AT BEDTIME., Disp: 45 tablet, Rfl: 2   VITAMIN D PO, Take 1 tablet  by mouth daily at 6 (six) AM., Disp: , Rfl:   Current Facility-Administered Medications:    levothyroxine (SYNTHROID) tablet 12.5 mcg, 12.5 mcg, Oral, Q0600, Crist Fat, MD   Review of Systems Review of Systems  Constitutional:  Negative for chills, fever and malaise/fatigue.  Respiratory:  Negative for cough and shortness of breath.   Cardiovascular:  Negative for chest pain, palpitations and leg swelling.  Gastrointestinal:  Positive for abdominal pain and nausea. Negative for blood in stool, constipation, diarrhea, melena and vomiting.  Genitourinary:  Negative for dysuria, frequency, hematuria and urgency.  Skin:  Negative for itching and rash.  Neurological:  Negative for dizziness, weakness and headaches.       Objective:    Vitals BP 122/70   Pulse 84   Temp 98.6 F (37 C)   Resp 16   Wt 234 lb (106.1 kg)    SpO2 98%   BMI 34.56 kg/m    Physical Examination Physical Exam Constitutional:      Appearance: Normal appearance. She is not ill-appearing.  Cardiovascular:     Rate and Rhythm: Normal rate and regular rhythm.     Pulses: Normal pulses.     Heart sounds: No murmur heard.    No friction rub. No gallop.  Pulmonary:     Effort: Pulmonary effort is normal. No respiratory distress.     Breath sounds: No wheezing, rhonchi or rales.  Abdominal:     General: Bowel sounds are normal. There is distension.     Palpations: Abdomen is soft.     Tenderness: There is no abdominal tenderness.  Musculoskeletal:     Right lower leg: No edema.     Left lower leg: No edema.  Skin:    General: Skin is warm and dry.     Findings: No rash.  Neurological:     Mental Status: She is alert.        Assessment & Plan:   Right upper quadrant abdominal pain She has pain that is limited to the RUQ of her abdomen.  On exam, I did not feel hepatomegaly.  We will obtain some labs and start out with a RUQ [place].  She is to start protonix.  Further care depends on these studies.  She is to go to the ER if her pain worsens.    No follow-ups on file.   Crist Fat, MD

## 2022-12-09 NOTE — Assessment & Plan Note (Signed)
She has pain that is limited to the RUQ of her abdomen.  On exam, I did not feel hepatomegaly.  We will obtain some labs and start out with a RUQ [place].  She is to start protonix.  Further care depends on these studies.  She is to go to the ER if her pain worsens.

## 2022-12-10 LAB — CMP14 + ANION GAP
ALT: 16 [IU]/L (ref 0–32)
AST: 17 [IU]/L (ref 0–40)
Albumin: 4.4 g/dL (ref 3.8–4.9)
Alkaline Phosphatase: 210 [IU]/L — ABNORMAL HIGH (ref 44–121)
Anion Gap: 17 mmol/L (ref 10.0–18.0)
BUN/Creatinine Ratio: 16 (ref 12–28)
BUN: 10 mg/dL (ref 8–27)
Bilirubin Total: 0.2 mg/dL (ref 0.0–1.2)
CO2: 25 mmol/L (ref 20–29)
Calcium: 9.2 mg/dL (ref 8.7–10.3)
Chloride: 101 mmol/L (ref 96–106)
Creatinine, Ser: 0.62 mg/dL (ref 0.57–1.00)
Globulin, Total: 2.7 g/dL (ref 1.5–4.5)
Glucose: 113 mg/dL — ABNORMAL HIGH (ref 70–99)
Potassium: 3.2 mmol/L — ABNORMAL LOW (ref 3.5–5.2)
Sodium: 143 mmol/L (ref 134–144)
Total Protein: 7.1 g/dL (ref 6.0–8.5)
eGFR: 102 mL/min/{1.73_m2} (ref >59)

## 2022-12-10 LAB — CBC WITH DIFFERENTIAL/PLATELET
Basophils Absolute: 0 10*3/uL (ref 0.0–0.2)
Basos: 1 %
EOS (ABSOLUTE): 0.4 10*3/uL (ref 0.0–0.4)
Eos: 5 %
Hematocrit: 37.6 % (ref 34.0–46.6)
Hemoglobin: 12.6 g/dL (ref 11.1–15.9)
Immature Grans (Abs): 0 10*3/uL (ref 0.0–0.1)
Immature Granulocytes: 0 %
Lymphocytes Absolute: 1.9 10*3/uL (ref 0.7–3.1)
Lymphs: 27 %
MCH: 30.7 pg (ref 26.6–33.0)
MCHC: 33.5 g/dL (ref 31.5–35.7)
MCV: 92 fL (ref 79–97)
Monocytes Absolute: 0.6 10*3/uL (ref 0.1–0.9)
Monocytes: 9 %
Neutrophils Absolute: 4 10*3/uL (ref 1.4–7.0)
Neutrophils: 58 %
Platelets: 229 10*3/uL (ref 150–450)
RBC: 4.1 x10E6/uL (ref 3.77–5.28)
RDW: 12.3 % (ref 11.7–15.4)
WBC: 6.9 10*3/uL (ref 3.4–10.8)

## 2022-12-10 LAB — AMYLASE: Amylase: 38 U/L (ref 31–110)

## 2022-12-10 LAB — LIPASE: Lipase: 33 U/L (ref 14–72)

## 2022-12-12 ENCOUNTER — Other Ambulatory Visit: Payer: Self-pay

## 2022-12-12 MED ORDER — POTASSIUM CHLORIDE ER 10 MEQ PO TBCR: 10.0000 meq | EXTENDED_RELEASE_TABLET | Freq: Every day | ORAL | 2 refills | Status: DC

## 2022-12-12 NOTE — Progress Notes (Signed)
Medication sent in.

## 2022-12-12 NOTE — Progress Notes (Signed)
Tried calling no answer, left vm will call back later as well.[name] [name]

## 2022-12-13 ENCOUNTER — Other Ambulatory Visit: Payer: Self-pay | Admitting: Internal Medicine

## 2022-12-13 DIAGNOSIS — Z1231 Encounter for screening mammogram for malignant neoplasm of breast: Secondary | ICD-10-CM

## 2022-12-19 NOTE — Progress Notes (Signed)
Patient called.  Patient aware.  VE: Tell her labs look good.  Her K+ is a bit low, start her on KCL po daily with her lasix.  You to Leonia Reader, Barbara Cower, MD 7 days ago  Note      Tried calling no answer, left vm will call back later as well.. Associated with: CBC WITH DIFFERENTIAL/PLATELET, CMP14 + ANION GAP, LIPASE, AMYLASE, POCT URINALYSIS DIPSTICK You to Crist Fat, MD 7 days ago Note       Medication sent in..  Patient was notified by Lyla Son on 12/14/2022

## 2022-12-22 ENCOUNTER — Ambulatory Visit
Admission: RE | Admit: 2022-12-22 | Discharge: 2022-12-22 | Disposition: A | Discharge status: Home / Self Care | Payer: Commercial Managed Care - PPO | Source: Ambulatory Visit | Attending: Internal Medicine | Admitting: Internal Medicine

## 2022-12-22 DIAGNOSIS — R1011 Right upper quadrant pain: Secondary | ICD-10-CM

## 2023-01-15 ENCOUNTER — Other Ambulatory Visit: Payer: Self-pay | Admitting: Internal Medicine

## 2023-01-17 NOTE — Progress Notes (Signed)
Her RUQ US shows fatty liver disease. She needs to eat healthy, exercise and lose weight.  Patient aware of labs

## 2023-01-27 ENCOUNTER — Ambulatory Visit: Payer: Commercial Managed Care - PPO | Admitting: Internal Medicine

## 2023-01-27 ENCOUNTER — Other Ambulatory Visit: Payer: Self-pay | Admitting: Internal Medicine

## 2023-01-27 ENCOUNTER — Encounter: Payer: Self-pay | Admitting: Internal Medicine

## 2023-01-27 VITALS — BP 154/88 | HR 103 | Temp 98.1°F | Resp 18 | Ht 69.0 in | Wt 228.4 lb

## 2023-01-27 DIAGNOSIS — R1011 Right upper quadrant pain: Secondary | ICD-10-CM

## 2023-01-27 DIAGNOSIS — F332 Major depressive disorder, recurrent severe without psychotic features: Secondary | ICD-10-CM | POA: Diagnosis not present

## 2023-01-27 DIAGNOSIS — E039 Hypothyroidism, unspecified: Secondary | ICD-10-CM

## 2023-01-27 DIAGNOSIS — M150 Primary generalized (osteo)arthritis: Secondary | ICD-10-CM | POA: Insufficient documentation

## 2023-01-27 DIAGNOSIS — R413 Other amnesia: Secondary | ICD-10-CM | POA: Diagnosis not present

## 2023-01-27 DIAGNOSIS — F411 Generalized anxiety disorder: Secondary | ICD-10-CM

## 2023-01-27 DIAGNOSIS — R252 Cramp and spasm: Secondary | ICD-10-CM | POA: Diagnosis not present

## 2023-01-27 DIAGNOSIS — R269 Unspecified abnormalities of gait and mobility: Secondary | ICD-10-CM

## 2023-01-27 LAB — POCT URINALYSIS DIPSTICK
Bilirubin, UA: NEGATIVE
Glucose, UA: NEGATIVE
Ketones, UA: NEGATIVE
Nitrite, UA: NEGATIVE
Protein, UA: NEGATIVE
Spec Grav, UA: 1.02 (ref 1.010–1.025)
Urobilinogen, UA: 0.2 U/dL
pH, UA: 6.5 (ref 5.0–8.0)

## 2023-01-27 MED ORDER — FUROSEMIDE 20 MG PO TABS: 20.0000 mg | ORAL_TABLET | Freq: Every day | ORAL | 1 refills | Status: AC

## 2023-01-27 MED ORDER — BUSPIRONE HCL 30 MG PO TABS: 30.0000 mg | ORAL_TABLET | Freq: Two times a day (BID) | ORAL | 0 refills | Status: DC

## 2023-01-27 MED ORDER — TRAZODONE HCL 50 MG PO TABS: 50.0000 mg | ORAL_TABLET | Freq: Every day | ORAL | 2 refills | Status: DC

## 2023-01-27 MED ORDER — AMOXICILLIN-POT CLAVULANATE 875-125 MG PO TABS: 1.0000 | ORAL_TABLET | Freq: Two times a day (BID) | ORAL | 0 refills | Status: AC

## 2023-01-27 MED ORDER — ROPINIROLE HCL 0.25 MG PO TABS: 0.2500 mg | ORAL_TABLET | Freq: Every day | ORAL | 1 refills | Status: AC

## 2023-01-27 MED ORDER — PANTOPRAZOLE SODIUM 40 MG PO TBEC: 40.0000 mg | DELAYED_RELEASE_TABLET | Freq: Every day | ORAL | 2 refills | Status: DC

## 2023-01-27 MED ORDER — POTASSIUM CHLORIDE ER 10 MEQ PO TBCR: 10.0000 meq | EXTENDED_RELEASE_TABLET | Freq: Every day | ORAL | 2 refills | Status: DC

## 2023-01-27 MED ORDER — ATENOLOL 25 MG PO TABS: 25.0000 mg | ORAL_TABLET | Freq: Every day | ORAL | 1 refills | Status: DC

## 2023-01-27 MED ORDER — ESCITALOPRAM OXALATE 10 MG PO TABS: 10.0000 mg | ORAL_TABLET | Freq: Every day | ORAL | 1 refills | Status: DC

## 2023-01-27 MED ORDER — CELECOXIB 200 MG PO CAPS: 200.0000 mg | ORAL_CAPSULE | Freq: Every day | ORAL | 3 refills | Status: DC

## 2023-01-27 MED ORDER — ALPRAZOLAM 0.5 MG PO TABS: 0.5000 mg | ORAL_TABLET | Freq: Every day | ORAL | 2 refills | Status: DC | PRN

## 2023-01-27 MED ORDER — LEVOTHYROXINE SODIUM 25 MCG PO TABS: 25.0000 ug | ORAL_TABLET | Freq: Every day | ORAL | 0 refills | Status: DC

## 2023-01-27 MED ORDER — ATORVASTATIN CALCIUM 80 MG PO TABS: 80.0000 mg | ORAL_TABLET | Freq: Every day | ORAL | 1 refills | Status: DC

## 2023-01-27 NOTE — Progress Notes (Signed)
 Office Visit  Subjective   Patient ID: [name] [name]   DOB: 10/01/1960   Age: 63 y.o.   MRN: [id number]   Chief Complaint Chief Complaint  Patient presents with   Follow-up     History of Present Illness [name] [name] is a 63 yo female who comes in today for a fitness of duty evaluation for work.  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  She was noted last month to have difficulty with completing charting and beside tasks.  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  Today, she states that she was forgetting to take her medications before thanksgiving and was taking them haphazardly before then and was forgetting to take her medicines.  She therefore just stopped her medications at Thanksgiving.  She tells me that her memory is effecting her and this started since her husband was in the hospital since 07/2021.  She complains of short term memory since then.   However, she tells me that she feels overwhelmed and her depression has been worse.  She states she feels like she is losing it.  Today, she states that she is more depressed and anxious than normal.  She states she is anxious and cries all the time.  I saw her last in 10/2022 where we had started her on cymbalta  in 09/2022 as she had just stopped her lexapro  2 weeks before that visit.  I felt she had moderate depression at that time and again was having a lot of problems with anxiety with her husband's health.  She was previously on lexapro  10mg  daily and buspar  30mg  BID and xanax  as needed.  I saw her in followup on 10/2022 where she stated at that time that it did help a lot but she had some increased anxiety about a week prior and started taking cymbalta  30mg  2 tabs (60mg ) in AM.  I also started her on low dose trazodone  50mg  at night for insomnia at that time in 10/2022.  She has used xanax  prn which she has  only had to use maybe one time over that month interval from 09/2022 until 10/2022.  Today, she has not taken any of her meds since Thanksgiving.  Today, she states that her depression and anxiety are severe.  She is having problems with concentration, feeling of guilt, hopelessness and worthlessness.  She is not eating well and is losing weight and is not sleeping well.  She is having loss of pleasurable activities.  She denies suicidal ideation and homicidal ideation. This patient feels that she is able to care for herself and her dependents. 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 She also takes care of her 32 year old grandson.. She denies a recent life crisis. She currently lives with her family.  She states she is having some soreness of her hands.  She has neck pain as well as pain in her lower back.  She states her arms will get numb at times.  She states her arthritis is probably what is effecting and she states that since stopping her celebrex  she started having all these pains.  She states when she goes down stairs her right leg will go out from her. S he is not sure if this is from  her hip arthritis.  She is having some gait abnormality at times as well but no dizziness.       Past Medical History Past Medical History:  Diagnosis Date   Anxiety    on meds   Cellulitis of left lower extremity 06/21/2017   Hyperlipidemia 06/21/2017   on meds   Hypertension    on meds   Osteoarthritis      Allergies No Known Allergies   Medications  Current Outpatient Medications:    escitalopram  (LEXAPRO ) 10 MG tablet, Take 1 tablet (10 mg total) by mouth daily., Disp: 30 tablet, Rfl: 1   ALPRAZolam  (XANAX ) 0.5 MG tablet, Take 1 tablet (0.5 mg total) by mouth daily as needed for anxiety., Disp: 30 tablet, Rfl: 2   Ascorbic Acid (VITAMIN C PO), Take 1 tablet by mouth daily at 6 (six) AM., Disp: , Rfl:     atenolol  (TENORMIN ) 25 MG tablet, Take 1 tablet (25 mg total) by mouth daily., Disp: 90 tablet, Rfl: 1   atorvastatin  (LIPITOR) 80 MG tablet, Take 1 tablet (80 mg total) by mouth daily., Disp: 90 tablet, Rfl: 1   busPIRone  (BUSPAR ) 30 MG tablet, Take 1 tablet (30 mg total) by mouth 2 (two) times daily., Disp: 180 tablet, Rfl: 0   celecoxib  (CELEBREX ) 200 MG capsule, Take 1 capsule (200 mg total) by mouth daily., Disp: 90 capsule, Rfl: 3   clobetasol  ointment (TEMOVATE ) 0.05 %, daily with bandage changes. Avoid applying to face, groin, and axilla., Disp: 60 g, Rfl: 2   furosemide  (LASIX ) 20 MG tablet, Take 1 tablet (20 mg total) by mouth daily., Disp: 90 tablet, Rfl: 1   levothyroxine  (SYNTHROID ) 25 MCG tablet, Take 1 tablet (25 mcg total) by mouth daily before breakfast., Disp: 90 tablet, Rfl: 0   Multiple Vitamins-Minerals (ZINC PO), Take 1 tablet by mouth daily at 6 (six) AM., Disp: , Rfl:    mupirocin  ointment (BACTROBAN ) 2 %, Apply to leg once daily with bandage change, Disp: 22 g, Rfl: 2   pantoprazole  (PROTONIX ) 40 MG tablet, Take 1 tablet (40 mg total) by mouth daily., Disp: 30 tablet, Rfl: 2   potassium chloride  (KLOR-CON ) 10 MEQ tablet, Take 1 tablet (10 mEq total) by mouth daily., Disp: 30 tablet, Rfl: 2   rOPINIRole  (REQUIP ) 0.25 MG tablet, Take 1 tablet (0.25 mg total) by mouth at bedtime., Disp: 180 tablet, Rfl: 1   traMADol  (ULTRAM ) 50 MG tablet, Take 1 tablet (50 mg total) by mouth every 8 (eight) hours as needed., Disp: 15 tablet, Rfl: 0   traZODone  (DESYREL ) 50 MG tablet, Take 1 tablet (50 mg total) by mouth at bedtime., Disp: 45 tablet, Rfl: 2   VITAMIN D PO, Take 1 tablet by mouth daily at 6 (six) AM., Disp: , Rfl:    Review of Systems Review of Systems  Constitutional:  Negative for chills and fever.  Eyes:  Negative for blurred vision and double vision.  Respiratory:  Negative for cough and shortness of breath.   Cardiovascular:  Negative for chest pain, palpitations and leg  swelling.  Gastrointestinal:  Negative for abdominal pain, constipation, diarrhea, nausea and vomiting.  Genitourinary:  Negative for frequency.  Musculoskeletal:  Positive for back pain and neck pain. Negative for myalgias.  Neurological:  Negative for dizziness, weakness and headaches.  Endo/Heme/Allergies:  Negative for polydipsia.  Psychiatric/Behavioral:  Positive for depression. Negative for substance abuse and suicidal ideas. The patient is nervous/anxious and has insomnia.  Objective:    Vitals BP (!) 154/88   Pulse (!) 103   Temp 98.1 F (36.7 C)   Resp 18   Ht 5' 9 (1.753 m)   Wt 228 lb 6.4 oz (103.6 kg)   SpO2 98%   BMI 33.73 kg/m    Physical Examination Physical Exam Constitutional:      Appearance: Normal appearance. She is not ill-appearing.  Cardiovascular:     Rate and Rhythm: Normal rate and regular rhythm.     Pulses: Normal pulses.     Heart sounds: No murmur heard.    No friction rub. No gallop.  Pulmonary:     Effort: Pulmonary effort is normal. No respiratory distress.     Breath sounds: No wheezing, rhonchi or rales.  Abdominal:     General: Bowel sounds are normal. There is no distension.     Palpations: Abdomen is soft.     Tenderness: There is no abdominal tenderness.  Musculoskeletal:     Right lower leg: No edema.     Left lower leg: No edema.  Skin:    General: Skin is warm and dry.     Findings: No rash.  Neurological:     General: No focal deficit present.     Mental Status: She is alert and oriented to person, place, and time.  Psychiatric:        Attention and Perception: Attention normal.        Mood and Affect: Affect is tearful.        Speech: Speech normal.        Behavior: Behavior normal. Behavior is cooperative.        Thought Content: Thought content normal. Thought content is not delusional. Thought content does not include homicidal or suicidal ideation.        Judgment: Judgment normal.     Comments: She is  dressed appropriately and appearance is well kept.        Assessment & Plan:   Hypothyroidism She stopped her thyroid  meds.  This could also make her depression and anxiety worse.  I am going to check her TFT's and restart her back on all her meds.  Primary osteoarthritis involving multiple joints She has arthritis that effects hands, neck, lower back and her hips.  I will start her back on celebrex  as that seemed to help her in the past.  Severe episode of recurrent major depressive disorder, without psychotic features (HCC) I think she has memory problems because her depression and anxiety have worsened.  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  I am going to stop the cymbalta  and restart her on lexapro  10mg  daily and restart her buspar  and trazodone .  I am going to refer her for counselling and to psychiatry at this time.  I do not think she can do her job functions at this time and she needs a temporary leave of abscence until we get her depression and anxiety more controlled.  I am going to see her back in 2 months.  Memory loss I think her memory is effected by her depression and anxiety.  We will check some labs today including a CBC, CMP, recheck her TFT's, Vitamin B12 and a RPR and UA.  I am going to do a MMSE on her today.  Gait abnormality She states her right leg will give out at  times.  She has hip arthritis but is not sure if she has a problem with her knee.  I am going to obtain xrays of her right hip and knee.  We will refer her to physical therapy.    Return in about 2 months (around 03/27/2023).   Selinda Fleeta Finger, MD

## 2023-01-27 NOTE — Assessment & Plan Note (Signed)
 She states her right leg will give out at times.  She has hip arthritis but is not sure if she has a problem with her knee.  I am going to obtain xrays of her right hip and knee.  We will refer her to physical therapy.

## 2023-01-27 NOTE — Assessment & Plan Note (Signed)
 She stopped her thyroid meds.  This could also make her depression and anxiety worse.  I am going to check her TFT's and restart her back on all her meds.

## 2023-01-27 NOTE — Assessment & Plan Note (Signed)
 I think she has memory problems because her depression and anxiety have worsened.  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  I am going to stop the cymbalta  and restart her on lexapro  10mg  daily and restart her buspar  and trazodone .  I am going to refer her for counselling and to psychiatry at this time.  I do not think she can do her job functions at this time and she needs a temporary leave of abscence until we get her depression and anxiety more controlled.  I am going to see her back in 2 months.

## 2023-01-27 NOTE — Assessment & Plan Note (Signed)
 She has arthritis that effects hands, neck, lower back and her hips.  I will start her back on celebrex as that seemed to help her in the past.

## 2023-01-27 NOTE — Assessment & Plan Note (Signed)
 I think her memory is effected by her depression and anxiety.  We will check some labs today including a CBC, CMP, recheck her TFT's, Vitamin B12 and a RPR and UA.  I am going to do a MMSE on her today.

## 2023-01-28 LAB — TSH: TSH: 2.27 u[IU]/mL (ref 0.450–4.500)

## 2023-01-28 LAB — CMP14 + ANION GAP
ALT: 15 [IU]/L (ref 0–32)
AST: 13 [IU]/L (ref 0–40)
Albumin: 4.2 g/dL (ref 3.9–4.9)
Alkaline Phosphatase: 134 [IU]/L — ABNORMAL HIGH (ref 44–121)
Anion Gap: 17 mmol/L (ref 10.0–18.0)
BUN/Creatinine Ratio: 18 (ref 12–28)
BUN: 11 mg/dL (ref 8–27)
Bilirubin Total: 0.2 mg/dL (ref 0.0–1.2)
CO2: 21 mmol/L (ref 20–29)
Calcium: 9.4 mg/dL (ref 8.7–10.3)
Chloride: 105 mmol/L (ref 96–106)
Creatinine, Ser: 0.6 mg/dL (ref 0.57–1.00)
Globulin, Total: 2.6 g/dL (ref 1.5–4.5)
Glucose: 102 mg/dL — ABNORMAL HIGH (ref 70–99)
Potassium: 3.3 mmol/L — ABNORMAL LOW (ref 3.5–5.2)
Sodium: 143 mmol/L (ref 134–144)
Total Protein: 6.8 g/dL (ref 6.0–8.5)
eGFR: 102 mL/min/{1.73_m2} (ref >59)

## 2023-01-28 LAB — CBC WITH DIFFERENTIAL/PLATELET
Basophils Absolute: 0 10*3/uL (ref 0.0–0.2)
Basos: 1 %
EOS (ABSOLUTE): 0.3 10*3/uL (ref 0.0–0.4)
Eos: 4 %
Hematocrit: 37.2 % (ref 34.0–46.6)
Hemoglobin: 12.6 g/dL (ref 11.1–15.9)
Immature Grans (Abs): 0 10*3/uL (ref 0.0–0.1)
Immature Granulocytes: 0 %
Lymphocytes Absolute: 2.3 10*3/uL (ref 0.7–3.1)
Lymphs: 30 %
MCH: 29.8 pg (ref 26.6–33.0)
MCHC: 33.9 g/dL (ref 31.5–35.7)
MCV: 88 fL (ref 79–97)
Monocytes Absolute: 0.5 10*3/uL (ref 0.1–0.9)
Monocytes: 7 %
Neutrophils Absolute: 4.6 10*3/uL (ref 1.4–7.0)
Neutrophils: 58 %
Platelets: 256 10*3/uL (ref 150–450)
RBC: 4.23 x10E6/uL (ref 3.77–5.28)
RDW: 13.1 % (ref 11.7–15.4)
WBC: 7.8 10*3/uL (ref 3.4–10.8)

## 2023-01-28 LAB — T4, FREE: Free T4: 1.04 ng/dL (ref 0.82–1.77)

## 2023-01-28 LAB — VITAMIN B12: Vitamin B-12: >2000 pg/mL — ABNORMAL HIGH (ref 232–1245)

## 2023-01-28 LAB — RPR: RPR Ser Ql: NONREACTIVE

## 2023-01-30 LAB — URINE CULTURE

## 2023-02-06 ENCOUNTER — Ambulatory Visit: Payer: Commercial Managed Care - PPO | Admitting: Psychology

## 2023-02-17 ENCOUNTER — Ambulatory Visit: Payer: Commercial Managed Care - PPO | Admitting: Internal Medicine

## 2023-02-21 ENCOUNTER — Ambulatory Visit (INDEPENDENT_AMBULATORY_CARE_PROVIDER_SITE_OTHER): Payer: Commercial Managed Care - PPO | Admitting: Psychology

## 2023-02-21 DIAGNOSIS — F411 Generalized anxiety disorder: Secondary | ICD-10-CM | POA: Diagnosis not present

## 2023-02-21 DIAGNOSIS — F332 Major depressive disorder, recurrent severe without psychotic features: Secondary | ICD-10-CM

## 2023-02-21 DIAGNOSIS — F329 Major depressive disorder, single episode, unspecified: Secondary | ICD-10-CM

## 2023-02-21 NOTE — Progress Notes (Signed)
 [facility] Counselor Initial Adult Exam  Name: [name] [name] Date: 02/21/2023 MRN: [id number] DOB: 1957/06/07 PCP: Fleeta Valeria Mayo, MD  Time Spent: 10 am - 10:55 am: 55 minutes  Guardian/Payee:  Self    Paperwork requested: No   Reason for Visit /Presenting Problem: I feel like I'm co-dependent; I take care of everyone else. My husband has dementia in addition to the MS. Pt told her husband that due to her pain, he needs to do more for himself and he has done that.  Mental Status Exam: Appearance:   Well Groomed     Behavior:  Appropriate  Motor:  Normal  Speech/Language:   Normal Rate  Affect:  Appropriate  Mood:  normal  Thought process:  normal  Thought content:    WNL  Sensory/Perceptual disturbances:    WNL  Orientation:  oriented to person, place, and time/date  Attention:  Good  Concentration:  Good  Memory:  WNL  Fund of knowledge:   Good  Insight:    Good  Judgment:   Good  Impulse Control:  Good   Reported Symptoms: Feeling nervous and on edge, not able to control worrying, trouble relaxing, being so restless that it's hard to sit still, becoming easily annoyed and irritable, felling afraid as if something awful might happen, little interest in doing things, feeling down, sleep trouble, having little energy, trouble with appetite, feeling bad about herself, trouble concentrating, being so restless have she has been moving around more than usual.   Risk Assessment: Danger to Self: No Self-injurious Behavior: No Danger to Others: No Duty to Warn:no Physical Aggression / Violence:No  Access to Firearms a concern: No  Gang Involvement:No  Patient / guardian was educated about steps to take if suicide or homicide risk level increases between visits: yes While future psychiatric events cannot be accurately predicted, the patient does not currently require acute inpatient psychiatric care and does not currently meet [facility]  involuntary commitment  criteria.  Substance Abuse History: Current substance abuse: No     Caffeine: N/A Tobacco: long history of smoking Alcohol: Not currently Substance use: N/A  Past Psychiatric History:   PCP documented since 2024 Outpatient Providers: N/A History of Psych Hospitalization:  N/A Psychological Testing:  N/A    Abuse History:  Victim of: No.,  N/A    Report needed: No. Victim of Neglect:No. Perpetrator of  N/A   Witness / Exposure to Domestic Violence: No   Protective Services Involvement: No  Witness to [employer] Violence:  No   Family History:  Family History  Problem Relation Age of Onset   Diabetes Mother    Liver disease Mother 18       NAFLD-   Colon polyps Father 88   Colon cancer Father 91   Colon polyps Brother 73   Esophageal cancer Neg Hx    Rectal cancer Neg Hx    Stomach cancer Neg Hx     Living situation: the patient lives with her husband and their grandson, Ethan. Jake's dad (Pt's son) comes and goes but he is helpful.    Sexual Orientation: Straight  Relationship Status: married  Name of spouse  If a parent, number of children / ages: Three adult children  Support Systems: Supportive relationships.  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 I live in [place], but I go outside [place], because it's less crowded.   Strengths: Supportive Relationships  Barriers:  Husband's MS diagnosis.    Legal History: Pending legal issue / charges: The patient has no significant history of legal issues. History of legal issue / charges:  N/A  Medical History/Surgical History: reviewed Past Medical History:  Diagnosis Date   Anxiety    on meds   Cellulitis of left lower extremity 06/21/2017   Hyperlipidemia 06/21/2017   on meds   Hypertension    on meds   Osteoarthritis     Past Surgical History:  Procedure Laterality Date   LAPAROSCOPIC CHOLECYSTECTOMY  2010   TUBAL LIGATION  1986    Medications: Current Outpatient Medications  Medication Sig Dispense Refill   ALPRAZolam  (XANAX ) 0.5 MG tablet Take 1 tablet (0.5 mg total) by mouth daily as needed for anxiety. 30 tablet 2   Ascorbic Acid (VITAMIN C PO) Take 1 tablet by mouth daily at 6 (six) AM.     atenolol  (TENORMIN ) 25 MG tablet Take 1 tablet (25 mg total) by mouth daily. 90 tablet 1   atorvastatin  (LIPITOR) 80 MG tablet Take 1 tablet (80 mg total) by mouth daily. 90 tablet 1   busPIRone  (BUSPAR ) 30 MG tablet Take 1 tablet (30 mg total) by mouth 2 (two) times daily. 180 tablet 0   celecoxib  (CELEBREX ) 200 MG capsule Take 1 capsule (200 mg total) by mouth daily. 90 capsule 3   clobetasol  ointment (TEMOVATE ) 0.05 % daily with bandage changes. Avoid applying to face, groin, and axilla. 60 g 2   escitalopram  (LEXAPRO ) 10 MG tablet Take 1 tablet (10 mg total) by mouth daily. 30 tablet 1   furosemide  (LASIX ) 20 MG tablet Take 1 tablet (20 mg total) by mouth daily. 90 tablet 1   levothyroxine  (SYNTHROID ) 25 MCG tablet Take 1 tablet (25 mcg total) by mouth daily before breakfast. 90 tablet 0   Multiple Vitamins-Minerals (ZINC PO) Take 1 tablet by mouth daily at 6 (six) AM.     mupirocin  ointment (BACTROBAN ) 2 % Apply to leg once daily with bandage change 22 g 2   pantoprazole  (PROTONIX ) 40 MG tablet Take 1 tablet (40 mg total) by mouth daily. 30 tablet 2   potassium chloride  (KLOR-CON ) 10 MEQ tablet Take 1 tablet (10 mEq total)  by mouth daily. 30 tablet 2   rOPINIRole  (REQUIP ) 0.25 MG tablet Take 1 tablet (0.25 mg total) by mouth at bedtime. 180 tablet 1   traMADol  (ULTRAM ) 50 MG tablet Take 1 tablet (50 mg total) by mouth every 8 (eight) hours as needed. 15 tablet 0   traZODone  (DESYREL ) 50 MG tablet Take 1 tablet (50 mg total) by mouth at bedtime. 45 tablet 2   VITAMIN D PO Take 1 tablet by mouth daily at 6 (six) AM.     No current facility-administered medications for this visit.    No Known Allergies  Diagnoses:  MDD and GAD  Psychiatric Treatment: Yes , via PCP  Plan of Care: OPT  Narrative:  Ronal Duane participated from office with therapist and consented to treatment. We reviewed the limits of confidentiality prior to the start of the evaluation.  Ronal Duane expressed understanding and agreement to proceed.   Patient is a 35 year year old female who presented for an initial assessment. Patient was referred by her PCP. Patient reported the following symptoms: feeling nervous and on edge, not able to control worrying, trouble relaxing, being so restless that it's hard to sit still, becoming easily annoyed and irritable, felling afraid as if something awful might happen, little interest in doing things, feeling down, sleep trouble, having little energy, trouble with appetite, feeling bad about herself, trouble concentrating, being so restless have she has been moving around more than usual.    Patient denied current and past suicidal ideation, homicidal ideation, and symptoms of psychosis. Patient reported long history of tobacco use. Patient denied current alcohol use, or current or past drug use. Patient reported current stressors as: her husband's MS diagnosis, financial difficulties, and her own health issues. Patient identified family members as current supports. Patient and her husband had their grandson, Ethan, come live with them when he was 62 years old due to severe family issues. Ethan is now 62  years old, has a learning disability and is working at [employer]. Daughter, Nat, lives in [place], [place]. Pt had Nat when she was 63 yo, we grew up together. Patient has a57 27 year old son, who is the reason patient and her husband moved here, and her son Lorrene who has 15 kids, 31 year old daughter, and 75 year old son. They all live in [place].  Both of patient's parents died when they were 72 year old. Son, Lorrene, [place] girl, 25 yo son,  live in [place]. Patient's husband dxd with MS in his 73's. Possibly due to [employer]. Once husband was unable to drive, everything went downhill.  A follow-up was scheduled to create a treatment plan and begin treatment. Therapist answered  and all questions during the evaluation and contact information was provided.     Jenkins CHRISTELLA Nicolas

## 2023-02-24 ENCOUNTER — Ambulatory Visit: Payer: Commercial Managed Care - PPO | Admitting: Internal Medicine

## 2023-02-24 ENCOUNTER — Encounter: Payer: Self-pay | Admitting: Internal Medicine

## 2023-02-24 VITALS — BP 132/80 | HR 77 | Temp 97.4°F | Resp 18 | Ht 69.0 in | Wt 214.5 lb

## 2023-02-24 DIAGNOSIS — M542 Cervicalgia: Secondary | ICD-10-CM

## 2023-02-24 DIAGNOSIS — M545 Low back pain, unspecified: Secondary | ICD-10-CM | POA: Diagnosis not present

## 2023-02-24 NOTE — Assessment & Plan Note (Signed)
 We willl send her to get a plain film of her neck.  We will refer her for outpatient PT for her neck as well.  I suspect she has osteoarthritis.

## 2023-02-24 NOTE — Progress Notes (Signed)
 Office Visit  Subjective   Patient ID: Jaylenn Baiza   DOB: [birth year]   Age: 63 y.o.   MRN: [id number]   Chief Complaint Chief Complaint  Patient presents with   Needs additional order for PT for her neck    She also is c/o: x2 weeks constant diarrhea, no appetite, losing weight, nausea.     History of Present Illness Mrs. Mcgriff comes in today for an acute visit as she is requesting to add physical therapy to her neck in addition to her other physical therapy she is receiving.  I saw her last month where she was being effect with arthritis of multiple joints including her hands, neck, lower back and hips.  She states she is having some soreness of her hands.  She has neck pain as well as pain in her lower back.  She states her arms and fingers get numb at times.  She states she is having some weakness in hands as well.   She states her arthritis is probably what is effecting and she states that since stopping her celebrex  she started having all these pains.  She states when she goes down stairs her right leg will go out from her. She is not sure if this is from her hip arthritis.  She was having some gait abnormality at times as well but no dizziness.  She states she has some weakness in her legs.  I started her back on celebrex  and she states this seems to help.  She has neck pain for a couple years but over the last few months this has worsened.  This pain is a dull aching pain in her mid neck that is continuous and her pain is mild.  She is asking for therapy for her neck.  We did obtain a plain xray of her hips on 02/23/2023 and this showed mild right hip osteoarthritis. We also did a xray of her bilateral knees and this was unremarkable.        Past Medical History Past Medical History:  Diagnosis Date   Anxiety    on meds   Cellulitis of left lower extremity 06/21/2017   Hyperlipidemia 06/21/2017   on meds   Hypertension    on meds   Osteoarthritis      Allergies No Known  Allergies   Medications  Current Outpatient Medications:    ALPRAZolam  (XANAX ) 0.5 MG tablet, Take 1 tablet (0.5 mg total) by mouth daily as needed for anxiety., Disp: 30 tablet, Rfl: 2   Ascorbic Acid (VITAMIN C PO), Take 1 tablet by mouth daily at 6 (six) AM., Disp: , Rfl:    atenolol  (TENORMIN ) 25 MG tablet, Take 1 tablet (25 mg total) by mouth daily., Disp: 90 tablet, Rfl: 1   atorvastatin  (LIPITOR) 80 MG tablet, Take 1 tablet (80 mg total) by mouth daily., Disp: 90 tablet, Rfl: 1   busPIRone  (BUSPAR ) 30 MG tablet, Take 1 tablet (30 mg total) by mouth 2 (two) times daily., Disp: 180 tablet, Rfl: 0   celecoxib  (CELEBREX ) 200 MG capsule, Take 1 capsule (200 mg total) by mouth daily., Disp: 90 capsule, Rfl: 3   escitalopram  (LEXAPRO ) 10 MG tablet, Take 1 tablet (10 mg total) by mouth daily., Disp: 30 tablet, Rfl: 1   furosemide  (LASIX ) 20 MG tablet, Take 1 tablet (20 mg total) by mouth daily., Disp: 90 tablet, Rfl: 1   levothyroxine  (SYNTHROID ) 25 MCG tablet, Take 1 tablet (25 mcg total) by mouth daily before breakfast.,  Disp: 90 tablet, Rfl: 0   Multiple Vitamins-Minerals (ZINC PO), Take 1 tablet by mouth daily at 6 (six) AM., Disp: , Rfl:    pantoprazole  (PROTONIX ) 40 MG tablet, Take 1 tablet (40 mg total) by mouth daily., Disp: 30 tablet, Rfl: 2   potassium chloride  (KLOR-CON ) 10 MEQ tablet, Take 1 tablet (10 mEq total) by mouth daily., Disp: 30 tablet, Rfl: 2   rOPINIRole  (REQUIP ) 0.25 MG tablet, Take 1 tablet (0.25 mg total) by mouth at bedtime., Disp: 180 tablet, Rfl: 1   traMADol  (ULTRAM ) 50 MG tablet, Take 1 tablet (50 mg total) by mouth every 8 (eight) hours as needed., Disp: 15 tablet, Rfl: 0   traZODone  (DESYREL ) 50 MG tablet, Take 1 tablet (50 mg total) by mouth at bedtime., Disp: 45 tablet, Rfl: 2   VITAMIN D PO, Take 1 tablet by mouth daily at 6 (six) AM., Disp: , Rfl:    Review of Systems Review of Systems  Constitutional:  Negative for chills and fever.  Respiratory:   Negative for shortness of breath.   Cardiovascular:  Negative for chest pain, palpitations and leg swelling.  Gastrointestinal:  Positive for diarrhea. Negative for abdominal pain, constipation, nausea and vomiting.  Musculoskeletal:  Positive for back pain, joint pain and neck pain. Negative for falls.  Neurological:  Negative for dizziness, weakness and headaches.       Objective:    Vitals BP 132/80 (BP Location: Right Arm, Patient Position: Sitting, Cuff Size: Normal)   Pulse 77   Temp (!) 97.4 F (36.3 C) (Temporal)   Resp 18   Ht 5' 9 (1.753 m)   Wt 214 lb 8 oz (97.3 kg)   SpO2 99%   BMI 31.68 kg/m    Physical Examination Physical Exam Constitutional:      Appearance: Normal appearance. She is not ill-appearing.  Cardiovascular:     Rate and Rhythm: Normal rate and regular rhythm.     Pulses: Normal pulses.     Heart sounds: No murmur heard.    No friction rub. No gallop.  Pulmonary:     Effort: Pulmonary effort is normal. No respiratory distress.     Breath sounds: No wheezing, rhonchi or rales.  Abdominal:     General: Bowel sounds are normal. There is no distension.     Palpations: Abdomen is soft.     Tenderness: There is no abdominal tenderness.  Musculoskeletal:     Right lower leg: No edema.     Left lower leg: No edema.  Skin:    General: Skin is warm and dry.     Findings: No rash.  Neurological:     General: No focal deficit present.     Mental Status: She is alert and oriented to person, place, and time.        Assessment & Plan:   Neck pain We willl send her to get a plain film of her neck.  We will refer her for outpatient PT for her neck as well.  I suspect she has osteoarthritis.  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  We will get an interval xray of her lumbar spine as well.    No follow-ups on file.   Selinda Fleeta Finger, MD

## 2023-02-24 NOTE — Assessment & Plan Note (Signed)
 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  We will get an interval xray of her lumbar spine as well.

## 2023-02-27 ENCOUNTER — Encounter: Payer: Self-pay | Admitting: Internal Medicine

## 2023-03-03 ENCOUNTER — Ambulatory Visit: Payer: Commercial Managed Care - PPO | Admitting: Psychology

## 2023-03-03 DIAGNOSIS — F332 Major depressive disorder, recurrent severe without psychotic features: Secondary | ICD-10-CM

## 2023-03-03 DIAGNOSIS — F329 Major depressive disorder, single episode, unspecified: Secondary | ICD-10-CM | POA: Diagnosis not present

## 2023-03-03 DIAGNOSIS — F411 Generalized anxiety disorder: Secondary | ICD-10-CM

## 2023-03-03 NOTE — Progress Notes (Signed)
 [facility] Counselor/Therapist Progress Note  Patient ID: [name] [name], MRN: [id number]    Date: 03/03/23  Time Spent: 10:00 am - 10:58 am : 58 minutes  Treatment Type: Individual Therapy.  Reported Symptoms: Feeling nervous and on edge, not able to control worrying, trouble relaxing, being so restless that it's hard to sit still, becoming easily annoyed and irritable, felling afraid as if something awful might happen, little interest in doing things, feeling down, sleep trouble, having little energy, trouble with appetite, feeling bad about herself, trouble concentrating, being so restless that she has been moving around more than usual.   Mental Status Exam: Appearance:  Well Groomed     Behavior: Appropriate  Motor: Normal  Speech/Language:  Normal Rate  Affect: Appropriate  Mood: normal  Thought process: normal  Thought content:   WNL  Sensory/Perceptual disturbances:   WNL  Orientation: oriented to person, place, and time/date  Attention: Good  Concentration: Good  Memory: WNL  Fund of knowledge:  Good  Insight:   Good  Judgment:  Good  Impulse Control: Good   Risk Assessment: Danger to Self:  No Self-injurious Behavior: No Danger to Others: No Duty to Warn:no Physical Aggression / Violence:No  Access to Firearms a concern: No  Gang Involvement:No   Subjective:   [name] [name] participated in the session, in person in the office with the therapist, and consented to treatment [name] [name] reviewed the events of the past week. We reviewed numerous treatment approaches including CBT, BA, Problem Solving, and Solution focused therapy. Psych-education regarding [name] [name]'s diagnosis of GAD and MDD was provided during the session.   We discussed [name] [name]'s treatment goals which include increasing motivation, setting goals, increase mindfulness, symptom management, engagement in consistent self-care. [name] [name] provided verbal approval of the treatment plan.    Interventions: Psycho-education & Goal Setting.   Diagnosis:  GAD and MDD  Psychiatric Treatment: Yes , via PCP  Treatment Plan:  Client Abilities/Strengths [name] [name] is bright, self-aware, motivated to take care of herself and her husband.   Support System: Supportive relationships.  Client Treatment Preferences OPT  Client Statement of Needs [name] [name] would like to increase motivation, setting goals, increase mindfulness, symptom management, engagement in consistent self-care.   Treatment Level Weekly/Biweekly  Symptoms  Anxiety: Feeling nervous and on edge, not able to control worrying, trouble relaxing, being so restless that it's hard to sit still, becoming easily annoyed and irritable, felling afraid as if something awful might happen   (Status: maintained) Depression: little interest in doing things, feeling down, sleep trouble, having little energy, trouble with appetite, feeling bad about herself, trouble concentrating, being so restless have she has been moving around more than usual.   (Status: maintained)  Goals:   [name] [name] experiences symptoms of anxiety and depression  Treatment plan signed and available on s-drive:  Yes    Target Date: 02/18/24 Frequency: Weekly/Biweekly  Progress: 0 Modality: individual    Therapist will provide referrals for additional resources as appropriate.  Therapist will provide psycho-education regarding [name] [name]'s diagnosis and corresponding treatment approaches and interventions. [name] [name] will support the patient's ability to achieve the goals identified. CBT, BA, Problem-solving, Solution Focused, Mindfulness, coping skills, & other evidenced-based practices will be used to promote progress towards healthy functioning to help manage decrease symptoms associated with their diagnosis.   Reduce overall level, frequency, and intensity of the feelings of depression, anxiety and panic evidenced by decreased overall symptoms from 6 to 7 days/week  to 0 to 1 days/week  per client report for at least 3 consecutive months. Verbally express understanding of the relationship between feelings of depression and anxiety and their impact on thinking patterns and behaviors. Verbalize an understanding of the role that distorted thinking plays in creating fears, excessive worry, and ruminations.  [name] [name] participated in the creation of the treatment plan)    [name] [name]

## 2023-03-14 ENCOUNTER — Other Ambulatory Visit: Payer: Self-pay | Admitting: Internal Medicine

## 2023-03-17 ENCOUNTER — Ambulatory Visit: Payer: Commercial Managed Care - PPO | Admitting: Psychology

## 2023-03-17 DIAGNOSIS — F411 Generalized anxiety disorder: Secondary | ICD-10-CM | POA: Diagnosis not present

## 2023-03-17 DIAGNOSIS — F329 Major depressive disorder, single episode, unspecified: Secondary | ICD-10-CM

## 2023-03-17 DIAGNOSIS — F332 Major depressive disorder, recurrent severe without psychotic features: Secondary | ICD-10-CM

## 2023-03-17 NOTE — Progress Notes (Unsigned)
 [facility] Counselor/Therapist Progress Note  Patient ID: [name] [name], MRN: [id number]    Date: 03/17/23  Time Spent: 10:  {onmampm:26702} - *** {onmampm:26702} : *** Minutes  Treatment Type: Individual Therapy.  Reported Symptoms: ***  Mental Status Exam: Appearance:  {PSY:22683}     Behavior: {PSY:21022743}  Motor: {PSY:22302}  Speech/Language:  {PSY:22685}  Affect: {PSY:22687}  Mood: {PSY:31886}  Thought process: {PSY:31888}  Thought content:   {[phone]}  Sensory/Perceptual disturbances:   {PSY:510-262-9133}  Orientation: {PSY:30297}  Attention: {PSY:22877}  Concentration: {PSY:805-380-1993}  Memory: {PSY:812-070-3444}  Fund of knowledge:  {PSY:805-380-1993}  Insight:   {PSY:805-380-1993}  Judgment:  {PSY:805-380-1993}  Impulse Control: {PSY:805-380-1993}   Risk Assessment: Danger to Self:  {PSY:22692} Self-injurious Behavior: {PSY:22692} Danger to Others: {PSY:22692} Duty to Warn:{PSY:311194} Physical Aggression / Violence:{PSY:21197} Access to Firearms a concern: {PSY:21197} Gang Involvement:{PSY:21197}  Subjective:   [name] [name] participated in the session, in person in the office with the therapist, and consented to treatment. Skie reviewed the events of the past week.   Medicaid came through and that was a big relief for patient. Patient discussed her strong work ethic, and the sense of loss associated with not being able to give 100%. Patient continues to assist her husband with his memory. Patient's grandson worries about patient and her husband. Raising her grandson was easier for patient than raising her son. Patient's grandson will be turning 62 years old in July.   Interventions: Cognitive Behavioral Therapy  Diagnosis:   No diagnosis found.  Psychiatric Treatment: Yes , via PCP  Treatment Plan:  Client Abilities/Strengths [name] ***  Support System: ***  Client Treatment Preferences ***  Client Statement of Needs Beatric would like  to ***   Treatment Level Weekly/Biweekly  Symptoms  ***   (Status: {Symptom Status:26744}) ***   (Status: {Symptom Status:26744})  Goals:   Samani experiences symptoms of ***   Target Date: *** Frequency: Weekly/Biweekly  Progress: 0 Modality: individual    Therapist will provide referrals for additional resources as appropriate.  Therapist will provide psycho-education regarding Elnora's diagnosis and corresponding treatment approaches and interventions. Helyn Numbers will support the patient's ability to achieve the goals identified. will employ CBT, BA, Problem-solving, Solution Focused, Mindfulness,  coping skills, & other evidenced-based practices will be used to promote progress towards healthy functioning to help manage decrease symptoms associated with their diagnosis.   Reduce overall level, frequency, and intensity of the feelings of depression, anxiety and panic evidenced by decreased overall symptoms from 6 to 7 days/week to 0 to 1 days/week per client report for at least 3 consecutive months. Verbally express understanding of the relationship between feelings of depression and anxiety and their impact on thinking patterns and behaviors. Verbalize an understanding of the role that distorted thinking plays in creating fears, excessive worry, and ruminations.  [name] participated in the creation of the treatment plan)      Helyn Numbers

## 2023-03-24 ENCOUNTER — Ambulatory Visit: Payer: Commercial Managed Care - PPO | Admitting: Psychology

## 2023-03-24 DIAGNOSIS — F332 Major depressive disorder, recurrent severe without psychotic features: Secondary | ICD-10-CM | POA: Diagnosis not present

## 2023-03-24 DIAGNOSIS — F411 Generalized anxiety disorder: Secondary | ICD-10-CM | POA: Diagnosis not present
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
## 2023-03-27 ENCOUNTER — Ambulatory Visit: Payer: Commercial Managed Care - PPO | Admitting: Internal Medicine

## 2023-03-27 ENCOUNTER — Encounter: Payer: Self-pay | Admitting: Internal Medicine

## 2023-03-27 VITALS — BP 134/82 | HR 83 | Temp 98.6°F | Resp 18 | Ht 69.0 in | Wt 221.4 lb

## 2023-03-27 DIAGNOSIS — M5441 Lumbago with sciatica, right side: Secondary | ICD-10-CM

## 2023-03-27 DIAGNOSIS — F411 Generalized anxiety disorder: Secondary | ICD-10-CM

## 2023-03-27 DIAGNOSIS — M545 Low back pain, unspecified: Secondary | ICD-10-CM

## 2023-03-27 DIAGNOSIS — F332 Major depressive disorder, recurrent severe without psychotic features: Secondary | ICD-10-CM | POA: Diagnosis not present

## 2023-03-27 DIAGNOSIS — M542 Cervicalgia: Secondary | ICD-10-CM | POA: Diagnosis not present

## 2023-03-27 DIAGNOSIS — G8929 Other chronic pain: Secondary | ICD-10-CM | POA: Insufficient documentation

## 2023-03-27 MED ORDER — CELECOXIB 200 MG PO CAPS: 200.0000 mg | ORAL_CAPSULE | Freq: Two times a day (BID) | ORAL | 3 refills | Status: DC

## 2023-03-27 MED ORDER — ESCITALOPRAM OXALATE 20 MG PO TABS: 20.0000 mg | ORAL_TABLET | Freq: Every day | ORAL | 2 refills | Status: DC

## 2023-03-27 NOTE — Assessment & Plan Note (Signed)
 She still has severe recurrent major depression.  Her anxiety is doing better and she is seeing a Haematologist regularly.  I will increase her lexapro from 10mg  to 20mg  daily.  She is to continue on the buspar and xanax as needed.  She does not want to be on trazodone and I am fine with that.  I am going to increase her celebrex, so hopefully she will get more sleep at night.  Today, she seems to be doing better than her last visit.  However, she is not yet ready to go back to work.  I will see her back in 2 months.

## 2023-03-27 NOTE — Assessment & Plan Note (Signed)
 Plan as below.

## 2023-03-27 NOTE — Assessment & Plan Note (Signed)
 We will obtain a cervical spine xray.  We will increase her celebrex.

## 2023-03-27 NOTE — Assessment & Plan Note (Signed)
 She has a radiculopathy on the right where she denies any weakness/numbness.  I am going to get a lumbar xray and increase her celebrex to 200mg  BID.

## 2023-03-27 NOTE — Progress Notes (Signed)
 Office Visit  Subjective   Patient ID: [name] [name]   DOB: [birth year]   Age: 63 y.o.   MRN: [id number]   Chief Complaint No chief complaint on file.    History of Present Illness [name] [name] is a 63 yo female who returns today for followup of her memory loss as well as her depression and generalized anxiety.  I saw her 3 months ago for a fitness of duty evaluation for work.  She was noted to have difficulty with completing charting and beside tasks.  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  She admitted to me that she was forgetting to take her medications before thanksgiving and was taking them haphazardly before then and was forgetting to take her medicines.  She therefore just stopped her medications at Thanksgiving.  She told me that her memory was effecting her and this started since her husband was in the hospital since 07/2021.  She complained of short term memory since then.   However, she tolds me that she felt overwhelmed and her depression had become worse.  She stated she "felt like she was losing it".  She stated that her anxiety was worsened and she was crying all the time.  I saw her  in 10/2022 where we had started her on cymbalta in 09/2022 as she had just stopped her lexapro 2 weeks before that visit.  I felt she had moderate depression at that time and again was having a lot of problems with anxiety with her husband's health.  She was previously on lexapro 10mg  daily and buspar 30mg  BID and xanax as needed.  I saw her in followup on 10/2022 where she stated at that time that it did help a lot but she had some increased anxiety about a week prior and started taking cymbalta 30mg  2 tabs (60mg ) in AM.  I also started her on low dose trazodone 50mg  at night for insomnia at that time in 10/2022.  She has used xanax prn which she has only had to use maybe one time over that month interval from 09/2022 until 10/2022.  Again, I  saw her in 02/2022 where she had not taken any of her meds since Thanksgiving.  Her depression and anxiety were severe at that time.  She was having problems with concentration, feeling of guilt, hopelessness and worthlessness.  She was not eating well and was losing weight and was not sleeping well.  She was having loss of pleasurable activities.  She denied suicidal ideation and homicidal ideation. This patient feels that she was able to care for herself and her dependents. 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 She also takes care of her 32 year old grandson..She denies a recent life crisis. She currently lives with her family.  I felt that her worsening depression and anxiety was causing memory problems.  I stopped her cymbalta and restarted her on lexapro 10mg  daily and restarted her buspar and trazodone.  I also referred her for counselling and to psychiatry.  I did mot think she could do her job functions at that time and she needs a temporary leave of abscence until we got her depression and anxiety more controlled.  Today, she states that her depression is still severe.  She is still having crying problems.  Her anxiety is doing better  where she states her anxiety is mild.  She has only had 2 panic attacks since her last visit.  She still has problems with sleep and she is sleeping 3-4 hours.  She states that she continues to have pain and this prevents her from sleeping.  She has not been taking trazodone.  She remains on lexapro 10mg  daily and buspar 30mg  BID.  She states her memory is doing better.  She did setup to see a psychologist/counsellor whom she sees once a week thru [employer].    I also had seen her for her osteoarthritis where she arthritis that effects hands, neck, lower back and her hips.  I started her back on celebrex as that seemed to help her in the past.  She was having soreness of  her hands.  She has neck pain as well as pain in her lower back and says she has right sciatic nerve pain.  She states her arms will get numb at times.  She states her arthritis is probably what is effecting and she states that since stopping her celebrex she started having all these pains.  She states when she goes down stairs her right leg will go out from her. She was not sure if this is from her hip arthritis.  She was having some gait abnormality at times as well but no dizziness.  We did xrays of her right hip on 02/23/2023 and this showed mild OA.  We also did a xray of her right knee and this was normal.  I did refer her to PT whom she has seen twice and she states they made her pain worse.  She had 2 falls since her last visit.  She was rushing and she lost her balance and fell but she had no injuries.     Past Medical History Past Medical History:  Diagnosis Date   Anxiety    on meds   Cellulitis of left lower extremity 06/21/2017   Hyperlipidemia 06/21/2017   on meds   Hypertension    on meds   Osteoarthritis      Allergies No Known Allergies   Medications  Current Outpatient Medications:    escitalopram (LEXAPRO) 20 MG tablet, Take 1 tablet (20 mg total) by mouth daily., Disp: 30 tablet, Rfl: 2   ALPRAZolam (XANAX) 0.5 MG tablet, Take 1 tablet (0.5 mg total) by mouth daily as needed for anxiety., Disp: 30 tablet, Rfl: 2   Ascorbic Acid (VITAMIN C PO), Take 1 tablet by mouth daily at 6 (six) AM., Disp: , Rfl:    atenolol (TENORMIN) 25 MG tablet, Take 1 tablet (25 mg total) by mouth daily., Disp: 90 tablet, Rfl: 1   atorvastatin (LIPITOR) 80 MG tablet, Take 1 tablet (80 mg total) by mouth daily., Disp: 90 tablet, Rfl: 1   busPIRone (BUSPAR) 30 MG tablet, Take 1 tablet (30 mg total) by mouth 2 (two) times daily., Disp: 180 tablet, Rfl: 0   celecoxib (CELEBREX) 200 MG capsule, Take 1 capsule (200 mg total) by mouth 2 (two) times daily., Disp: 180 capsule, Rfl: 3   furosemide  (LASIX) 20 MG tablet, Take 1 tablet (20 mg total) by mouth daily., Disp: 90 tablet, Rfl: 1   levothyroxine (SYNTHROID) 25 MCG tablet, TAKE 1 TABLET BY MOUTH DAILY BEFORE BREAKFAST., Disp: 90 tablet, Rfl: 0   Multiple Vitamins-Minerals (ZINC PO), Take 1 tablet by mouth daily at 6 (six) AM., Disp: , Rfl:    pantoprazole (PROTONIX) 40 MG tablet,  Take 1 tablet (40 mg total) by mouth daily., Disp: 30 tablet, Rfl: 2   potassium chloride (KLOR-CON) 10 MEQ tablet, Take 1 tablet (10 mEq total) by mouth daily., Disp: 30 tablet, Rfl: 2   rOPINIRole (REQUIP) 0.25 MG tablet, Take 1 tablet (0.25 mg total) by mouth at bedtime., Disp: 180 tablet, Rfl: 1   traMADol (ULTRAM) 50 MG tablet, Take 1 tablet (50 mg total) by mouth every 8 (eight) hours as needed., Disp: 15 tablet, Rfl: 0   VITAMIN D PO, Take 1 tablet by mouth daily at 6 (six) AM., Disp: , Rfl:    Review of Systems Review of Systems  Constitutional:  Negative for chills and fever.  Eyes:  Negative for blurred vision and double vision.  Respiratory:  Negative for shortness of breath.   Cardiovascular:  Negative for chest pain, palpitations and leg swelling.  Gastrointestinal:  Positive for diarrhea. Negative for abdominal pain, heartburn, nausea and vomiting.  Musculoskeletal:  Positive for back pain, falls, joint pain and neck pain.  Neurological:  Negative for dizziness, weakness and headaches.       Objective:    Vitals BP 134/82   Pulse 83   Temp 98.6 F (37 C)   Resp 18   Ht 5\' 9"  (1.753 m)   Wt 221 lb 6.4 oz (100.4 kg)   SpO2 99%   BMI 32.70 kg/m    Physical Examination Physical Exam Constitutional:      Appearance: Normal appearance. She is not ill-appearing.  Cardiovascular:     Rate and Rhythm: Normal rate and regular rhythm.     Pulses: Normal pulses.     Heart sounds: No murmur heard.    No friction rub. No gallop.  Pulmonary:     Effort: Pulmonary effort is normal. No respiratory distress.     Breath sounds: No  wheezing, rhonchi or rales.  Abdominal:     General: Bowel sounds are normal. There is no distension.     Palpations: Abdomen is soft.     Tenderness: There is no abdominal tenderness.  Musculoskeletal:     Right lower leg: No edema.     Left lower leg: No edema.  Skin:    General: Skin is warm and dry.     Findings: No rash.  Neurological:     General: No focal deficit present.     Mental Status: She is alert and oriented to person, place, and time.  Psychiatric:        Mood and Affect: Mood normal.        Behavior: Behavior normal.        Assessment & Plan:   Severe episode of recurrent major depressive disorder, without psychotic features (HCC) She still has severe recurrent major depression.  Her anxiety is doing better and she is seeing a Haematologist regularly.  I will increase her lexapro from 10mg  to 20mg  daily.  She is to continue on the buspar and xanax as needed.  She does not want to be on trazodone and I am fine with that.  I am going to increase her celebrex, so hopefully she will get more sleep at night.  Today, she seems to be doing better than her last visit.  However, she is not yet ready to go back to work.  I will see her back in 2 months.  GAD (generalized anxiety disorder) Plan as below.  Neck pain We will obtain a cervical spine xray.  We will increase her celebrex.  Lumbar back pain She has a radiculopathy on the right where she denies any weakness/numbness.  I am going to get a lumbar xray and increase her celebrex to 200mg  BID.    Return in about 2 months (around 05/27/2023).   Crist Fat, MD

## 2023-03-31 ENCOUNTER — Encounter: Payer: Commercial Managed Care - PPO | Admitting: Psychology

## 2023-04-01 NOTE — Progress Notes (Signed)
 This encounter was created in error - please disregard.

## 2023-04-07 ENCOUNTER — Ambulatory Visit (INDEPENDENT_AMBULATORY_CARE_PROVIDER_SITE_OTHER): Payer: Commercial Managed Care - PPO | Admitting: Psychology

## 2023-04-07 DIAGNOSIS — F411 Generalized anxiety disorder: Secondary | ICD-10-CM

## 2023-04-07 DIAGNOSIS — F332 Major depressive disorder, recurrent severe without psychotic features: Secondary | ICD-10-CM

## 2023-04-07 NOTE — Progress Notes (Signed)
 [facility] Counselor/Therapist Progress Note  Patient ID: [name] [name], MRN: [id number]    Date: 04/07/23  Time Spent: 10:05 am - 10:58 am : 53 minutes  Treatment Type: Individual Therapy.  Reported Symptoms:  Feeling nervous and on edge, not able to control worrying, trouble relaxing, being so restless that it's hard to sit still, becoming easily annoyed and irritable, felling afraid as if something awful might happen, little interest in doing things, feeling down, sleep trouble, having little energy, trouble with appetite, feeling bad about herself, trouble concentrating, being so restless that she has been moving around more than usual.   Mental Status Exam: Appearance:  Well Groomed     Behavior: Appropriate  Motor: Normal  Speech/Language:  Normal Rate  Affect: Appropriate  Mood: normal  Thought process: normal  Thought content:   WNL  Sensory/Perceptual disturbances:   WNL  Orientation: oriented to person, place, and time/date  Attention: Good  Concentration: Good  Memory: WNL  Fund of knowledge:  Good  Insight:   Good  Judgment:  Good  Impulse Control: Good   Risk Assessment: Danger to Self:  No Self-injurious Behavior: No Danger to Others: No Duty to Warn:no Physical Aggression / Violence:No  Access to Firearms a concern: No  Gang Involvement:No   Subjective:   [name] participated in the session, in person in the office with the therapist, and consented to treatment. Baker reviewed the events of the past week.   Patient feels like she is "under water" because so much is going on. 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 Patient felt like her husband, Delane Ginger, was being more of "a partner" to her than in the past. Patient stated that she took too much care of Gill in the past and now  she is giving him projects to do, and that helps her. Jake's grandmother has died and patient's grandson, Leta Jungling, and patient will be flying up to [place] for [employer]. We discussed safety tips patient can utilize for flying for the first time in many years. Patient was receptive to preparing in a safe manner for flying.   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   Support System: Supportive relationships  Client Treatment Preferences OPT  Client Statement of Needs Quanna would like to  increase motivation, setting goals, increase mindfulness, symptom management, engagement in consistent self-care        Treatment Level Biweekly/Weekly  Symptoms  Anxiety: Feeling nervous and on edge, not able to control worrying, trouble relaxing, being so restless that it's hard to sit still, becoming easily annoyed and irritable, felling afraid as if something awful might happen   (Status: maintained) Depression: little interest in doing things, feeling down, sleep trouble, having little energy, trouble with appetite, feeling bad about herself, trouble concentrating, being so restless have she has been moving around more than usual.   (Status: maintained)  Goals:   Sheleen experiences symptoms of anxiety and depression.   Target Date: 02/18/24 Frequency: Weekly/Biweekly  Progress: 0 Modality: individual    Therapist will provide referrals for  additional resources as appropriate.  Therapist will provide psycho-education regarding Iyanni's diagnosis and corresponding treatment approaches and interventions. Helyn Numbers will support the  patient's ability to achieve the goals identified. will employ CBT, BA, Problem-solving, Solution Focused, Mindfulness,  coping skills, & other evidenced-based practices will be used to promote progress towards healthy functioning to help manage decrease symptoms associated with their diagnosis.   Reduce overall level, frequency, and intensity of the feelings of depression, anxiety and panic evidenced by decreased overall symptoms from 6 to 7 days/week to 0 to 1 days/week per client report for at least 3 consecutive months. Verbally express understanding of the relationship between feelings of depression and anxiety and their impact on thinking patterns and behaviors. Verbalize an understanding of the role that distorted thinking plays in creating fears, excessive worry, and ruminations.  [name] participated in the creation of the treatment plan)    Helyn Numbers

## 2023-04-13 ENCOUNTER — Other Ambulatory Visit: Payer: Self-pay | Admitting: Internal Medicine

## 2023-04-14 ENCOUNTER — Ambulatory Visit: Payer: Commercial Managed Care - PPO | Admitting: Psychology

## 2023-04-17 ENCOUNTER — Other Ambulatory Visit: Payer: Self-pay | Admitting: Internal Medicine

## 2023-04-17 MED ORDER — TRAMADOL HCL 50 MG PO TABS: 50.0000 mg | ORAL_TABLET | Freq: Three times a day (TID) | ORAL | 0 refills | Status: DC | PRN

## 2023-04-21 ENCOUNTER — Ambulatory Visit: Payer: Commercial Managed Care - PPO | Admitting: Psychology

## 2023-04-21 ENCOUNTER — Other Ambulatory Visit: Payer: Self-pay

## 2023-04-21 MED ORDER — AMOXICILLIN-POT CLAVULANATE 875-125 MG PO TABS: 1.0000 | ORAL_TABLET | Freq: Two times a day (BID) | ORAL | 0 refills | Status: DC

## 2023-04-21 NOTE — Progress Notes (Signed)
 New Rx

## 2023-04-24 ENCOUNTER — Other Ambulatory Visit: Payer: Self-pay | Admitting: Internal Medicine

## 2023-04-25 ENCOUNTER — Other Ambulatory Visit: Payer: Self-pay | Admitting: Internal Medicine

## 2023-04-28 ENCOUNTER — Ambulatory Visit: Payer: Commercial Managed Care - PPO | Admitting: Psychology

## 2023-05-05 ENCOUNTER — Ambulatory Visit: Payer: Commercial Managed Care - PPO | Admitting: Psychology

## 2023-05-12 ENCOUNTER — Ambulatory Visit: Payer: Commercial Managed Care - PPO | Admitting: Psychology

## 2023-05-19 ENCOUNTER — Ambulatory Visit: Payer: Commercial Managed Care - PPO | Admitting: Psychology

## 2023-05-26 ENCOUNTER — Other Ambulatory Visit: Payer: Self-pay | Admitting: Internal Medicine

## 2023-05-26 ENCOUNTER — Ambulatory Visit: Admitting: Psychology

## 2023-05-26 MED ORDER — TRAMADOL HCL 50 MG PO TABS
50.0000 mg | ORAL_TABLET | Freq: Three times a day (TID) | ORAL | 0 refills | Status: DC | PRN
Start: 2023-05-26 — End: 2023-05-29

## 2023-05-26 MED ORDER — ALPRAZOLAM 0.5 MG PO TABS: 0.5000 mg | ORAL_TABLET | Freq: Every day | ORAL | 2 refills | Status: AC | PRN

## 2023-05-29 ENCOUNTER — Ambulatory Visit: Admitting: Internal Medicine

## 2023-05-29 ENCOUNTER — Encounter: Payer: Self-pay | Admitting: Internal Medicine

## 2023-05-29 VITALS — BP 122/80 | HR 88 | Temp 98.4°F | Resp 18 | Ht 69.0 in | Wt 220.6 lb

## 2023-05-29 DIAGNOSIS — F411 Generalized anxiety disorder: Secondary | ICD-10-CM

## 2023-05-29 DIAGNOSIS — R32 Unspecified urinary incontinence: Secondary | ICD-10-CM | POA: Insufficient documentation

## 2023-05-29 DIAGNOSIS — G894 Chronic pain syndrome: Secondary | ICD-10-CM | POA: Diagnosis not present

## 2023-05-29 DIAGNOSIS — F331 Major depressive disorder, recurrent, moderate: Secondary | ICD-10-CM

## 2023-05-29 DIAGNOSIS — M150 Primary generalized (osteo)arthritis: Secondary | ICD-10-CM

## 2023-05-29 MED ORDER — CELECOXIB 200 MG PO CAPS: 200.0000 mg | ORAL_CAPSULE | Freq: Every day | ORAL | 3 refills | Status: AC

## 2023-05-29 MED ORDER — HYDROCODONE-ACETAMINOPHEN 5-325 MG PO TABS: 0.5000 | ORAL_TABLET | Freq: Two times a day (BID) | ORAL | 0 refills | Status: DC | PRN

## 2023-05-29 MED ORDER — BUPROPION HCL ER (XL) 150 MG PO TB24: 150.0000 mg | ORAL_TABLET | Freq: Every day | ORAL | 1 refills | Status: DC

## 2023-05-29 NOTE — Progress Notes (Signed)
 Office Visit  Subjective   Patient ID: [name] [name]   DOB: 08-07-1960   Age: 63 y.o.   MRN: [id number]   Chief Complaint Chief Complaint  Patient presents with   Follow-up     History of Present Illness [name] [name] is a 63 yo female who returns today for followup of her memory loss as well as her depression and generalized anxiety.  I saw her 2 months ago and I felt she was having a severe episode of recurrent major depression.  Her anxiety was doing better and she was still talking to a counsellor at that time.  I increase her lexapro  from 10mg  to 20mg  daily and continued her buspar  and asked her to use xanax  as needed.  She uses the xanax  maybe 1-2 times per week.  She states that her memory is doing better but she still has problems.  Again, I saw her 5 months ago for a fitness of duty evaluation for work.  She was noted to have difficulty with completing charting and beside tasks.  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  She admitted to me that she was forgetting to take her medications before thanksgiving and was taking them haphazardly before then and was forgetting to take her medicines.  She therefore just stopped her medications at Thanksgiving.  She told me that her memory was effecting her and this started since her husband was in the hospital since 07/2021.  She complained of short term memory since then.   However, she tolds me that she felt overwhelmed and her depression had become worse.  She stated she "felt like she was losing it".  She stated that her anxiety was worsened and she was crying all the time.  I saw her  in 10/2022 where we had started her on cymbalta  in 09/2022 as she had just stopped her lexapro  2 weeks before that visit.  I felt she had moderate depression at that time and again was having a lot of problems with anxiety with her husband's health.  She was previously on lexapro  10mg  daily and buspar  30mg   BID and xanax  as needed.  I saw her in followup on 10/2022 where she stated at that time that it did help a lot but she had some increased anxiety about a week prior and started taking cymbalta  30mg  2 tabs (60mg ) in AM.  I also started her on low dose trazodone  50mg  at night for insomnia at that time in 10/2022.  She has used xanax  prn which she has only had to use maybe one time over that month interval from 09/2022 until 10/2022.  Again, I saw her in 02/2022 where she had not taken any of her meds since Thanksgiving.  Her depression and anxiety were severe at that time.  She was having problems with concentration, feeling of guilt, hopelessness and worthlessness.  She was not eating well and was losing weight and was not sleeping well.  She was having loss of pleasurable activities.  She denied suicidal ideation and homicidal ideation. This patient feels that she was able to care for herself and her dependents. 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 She also takes care of her 34 year old grandson..She denies a recent life crisis. She currently lives with her family.  I felt that her worsening  depression and anxiety was causing memory problems.  I stopped her cymbalta  and restarted her on lexapro  10mg  daily and restarted her buspar  and trazodone .  I also referred her for counselling and to psychiatry.  I did not think she could do her job functions at that time and she needs a temporary leave of abscence until we got her depression and anxiety more controlled.  She still has problems with sleep and she is sleeping 3-4 hours due to pain. She states that she continues to have pain and this prevents her from sleeping.  She has not been taking trazodone .  She remains on lexapro  10mg  daily and buspar  30mg  BID.  She states her memory is doing better.  She was seeeing a psychologist/counsellor whom she was seeing once a week thru  [facility] but over the interim, the patient states she stopped going as it was not helping.     I also had seen her for her osteoarthritis where she arthritis that effects hands, neck, lower back and her hips.  I started her back on celebrex  as that seemed to help her in the past.  On her last visit, we did increase her celebrex  to 200mg  BID.  We also ordered xray of the lumbar and cervical on 05/25/2023 and this showed degenerative changes of the cervical and lumbrosarcral spine.She was having soreness of her hands.  She has neck pain as well as pain in her lower back and says she has right sciatic nerve pain.  She states her arms will get numb at times.  She states her arthritis is probably what is effecting and she states that since stopping her celebrex  she started having all these pains.  She states when she goes down stairs her right leg will go out from her. She was not sure if this is from her hip arthritis.  She was having some gait abnormality at times as well but no dizziness.  We did xrays of her right hip on 02/23/2023 and this showed mild OA.  We also did a xray of her right knee and this was normal.  I did refer her to PT whom she has seen twice and she states they made her pain worse.  She had 2 falls since her last visit.  She was rushing and she lost her balance and fell but she had no injuries.  She was written for tramadol  in the past but she states this did not help with the pain.  She has tried tylenol  in the past but this did not help.  She states ibuprofen  seemed to help but again we stopped that and started on celebrex ..    She states she has a new problem with being incontinent of bowel and bladder.  She states this occurred around 12/2022.  She has tried [employer] which has helped.  She looses control of her bladder and denies any urinary urgency or hesitancy.  She is on lasix  daily.        Past Medical History Past Medical History:  Diagnosis Date   Anxiety    on meds    Cellulitis of left lower extremity 06/21/2017   Hyperlipidemia 06/21/2017   on meds   Hypertension    on meds   Osteoarthritis      Allergies No Known Allergies   Medications  Current Outpatient Medications:    ALPRAZolam  (XANAX ) 0.5 MG tablet, Take 1 tablet (0.5 mg total) by mouth daily as needed for anxiety., Disp: 30 tablet, Rfl:  2   Ascorbic Acid (VITAMIN C PO), Take 1 tablet by mouth daily at 6 (six) AM., Disp: , Rfl:    atenolol  (TENORMIN ) 25 MG tablet, Take 1 tablet (25 mg total) by mouth daily., Disp: 90 tablet, Rfl: 1   atorvastatin  (LIPITOR) 80 MG tablet, Take 1 tablet (80 mg total) by mouth daily., Disp: 90 tablet, Rfl: 1   busPIRone  (BUSPAR ) 30 MG tablet, Take 1 tablet (30 mg total) by mouth 2 (two) times daily., Disp: 180 tablet, Rfl: 0   celecoxib  (CELEBREX ) 200 MG capsule, Take 1 capsule (200 mg total) by mouth 2 (two) times daily., Disp: 180 capsule, Rfl: 3   escitalopram  (LEXAPRO ) 20 MG tablet, TAKE 1 TABLET BY MOUTH EVERY DAY, Disp: 90 tablet, Rfl: 1   furosemide  (LASIX ) 20 MG tablet, Take 1 tablet (20 mg total) by mouth daily., Disp: 90 tablet, Rfl: 1   levothyroxine  (SYNTHROID ) 25 MCG tablet, TAKE 1 TABLET BY MOUTH DAILY BEFORE BREAKFAST., Disp: 90 tablet, Rfl: 0   Multiple Vitamins-Minerals (ZINC PO), Take 1 tablet by mouth daily at 6 (six) AM., Disp: , Rfl:    pantoprazole  (PROTONIX ) 40 MG tablet, Take 1 tablet (40 mg total) by mouth daily., Disp: 30 tablet, Rfl: 2   potassium chloride  (KLOR-CON ) 10 MEQ tablet, Take 1 tablet (10 mEq total) by mouth daily., Disp: 30 tablet, Rfl: 2   rOPINIRole  (REQUIP ) 0.25 MG tablet, Take 1 tablet (0.25 mg total) by mouth at bedtime., Disp: 180 tablet, Rfl: 1   VITAMIN D PO, Take 1 tablet by mouth daily at 6 (six) AM., Disp: , Rfl:    Review of Systems Review of Systems  Constitutional:  Negative for chills and fever.  Eyes:  Negative for blurred vision and double vision.  Respiratory:  Negative for shortness of breath.    Cardiovascular:  Negative for chest pain, palpitations and leg swelling.  Gastrointestinal:  Negative for abdominal pain, constipation, diarrhea, nausea and vomiting.  Musculoskeletal:  Positive for back pain, joint pain and neck pain. Negative for falls.  Skin:  Negative for itching and rash.  Neurological:  Negative for dizziness, weakness and headaches.       Objective:    Vitals BP 122/80   Pulse 88   Temp 98.4 F (36.9 C)   Resp 18   Ht 5\' 9"  (1.753 m)   Wt 220 lb 9.6 oz (100.1 kg)   SpO2 99%   BMI 32.58 kg/m    Physical Examination Physical Exam Constitutional:      Appearance: Normal appearance. She is not ill-appearing.  Cardiovascular:     Rate and Rhythm: Normal rate and regular rhythm.     Pulses: Normal pulses.     Heart sounds: No murmur heard.    No friction rub. No gallop.  Pulmonary:     Effort: Pulmonary effort is normal. No respiratory distress.     Breath sounds: No wheezing, rhonchi or rales.  Abdominal:     General: Bowel sounds are normal. There is no distension.     Palpations: Abdomen is soft.     Tenderness: There is no abdominal tenderness.  Musculoskeletal:     Right lower leg: No edema.     Left lower leg: No edema.  Skin:    General: Skin is warm and dry.     Findings: No rash.  Neurological:     Mental Status: She is alert.        Assessment & Plan:   Primary osteoarthritis involving  multiple joints Plan as described.  Chronic pain syndrome She has had severe arthritis pain for about 2 years.  Her tramadol  did not help.  I am going to start her on hydrocodoneAPAP 5/325mg  1/2 tab po q 12 hrs prn.  I want her to cut her celebrex  down to 200mg  once a day.  We will see her back in 1 month for a pain followup.  Her xrays were reviewed.  Her [place] CSR/PDMP was reviewed.  She is not to use her xanax  anytime near her hydrocodone use.  This was discussed.  GAD (generalized anxiety disorder) Plan as below.  Moderate episode of recurrent  major depressive disorder (HCC) Her anxiety is doing well and her depression is improved.  I wanted her to see psychiatry.  We will refer her.  I am going to continue on lexapro  20mg  daily and add wellbutrin XL 150mg  daily to her regimen.  Her memory has improved but she is still having some memory loss.  She has problems with her sleep so hopefully with pain control her sleep will improve.  Urinary incontinence We will refer to Urology.    Return in about 6 weeks (around 07/10/2023).   Wayne Haines, MD

## 2023-05-29 NOTE — Assessment & Plan Note (Signed)
 She has had severe arthritis pain for about 2 years.  Her tramadol  did not help.  I am going to start her on hydrocodoneAPAP 5/325mg  1/2 tab po q 12 hrs prn.  I want her to cut her celebrex  down to 200mg  once a day.  We will see her back in 1 month for a pain followup.  Her xrays were reviewed.  Her [place] CSR/PDMP was reviewed.  She is not to use her xanax  anytime near her hydrocodone use.  This was discussed.

## 2023-05-29 NOTE — Assessment & Plan Note (Signed)
We will refer to Urology

## 2023-05-29 NOTE — Assessment & Plan Note (Signed)
 Her anxiety is doing well and her depression is improved.  I wanted her to see psychiatry.  We will refer her.  I am going to continue on lexapro  20mg  daily and add wellbutrin XL 150mg  daily to her regimen.  Her memory has improved but she is still having some memory loss.  She has problems with her sleep so hopefully with pain control her sleep will improve.

## 2023-05-29 NOTE — Assessment & Plan Note (Signed)
Plan as described.

## 2023-05-29 NOTE — Assessment & Plan Note (Signed)
 Plan as below.

## 2023-06-01 ENCOUNTER — Encounter: Payer: Self-pay | Admitting: Internal Medicine

## 2023-06-02 ENCOUNTER — Ambulatory Visit: Admitting: Psychology

## 2023-06-09 ENCOUNTER — Ambulatory Visit: Admitting: Psychology

## 2023-06-16 ENCOUNTER — Ambulatory Visit: Admitting: Psychology

## 2023-06-20 ENCOUNTER — Other Ambulatory Visit: Payer: Self-pay | Admitting: Internal Medicine

## 2023-06-30 ENCOUNTER — Encounter: Payer: Self-pay | Admitting: Internal Medicine

## 2023-06-30 ENCOUNTER — Ambulatory Visit: Admitting: Internal Medicine

## 2023-06-30 VITALS — BP 128/82 | HR 70 | Temp 98.2°F | Resp 18 | Ht 69.0 in | Wt 218.6 lb

## 2023-06-30 DIAGNOSIS — N3281 Overactive bladder: Secondary | ICD-10-CM | POA: Diagnosis not present

## 2023-06-30 DIAGNOSIS — R350 Frequency of micturition: Secondary | ICD-10-CM

## 2023-06-30 LAB — POCT URINALYSIS DIPSTICK
Bilirubin, UA: NEGATIVE
Blood, UA: NEGATIVE
Glucose, UA: NEGATIVE
Ketones, UA: NEGATIVE
Leukocytes, UA: NEGATIVE
Nitrite, UA: NEGATIVE
Protein, UA: POSITIVE — AB
Spec Grav, UA: >=1.03 — AB (ref 1.010–1.025)
Urobilinogen, UA: 0.2 U/dL
pH, UA: 6 (ref 5.0–8.0)

## 2023-06-30 MED ORDER — GEMTESA 75 MG PO TABS: 1.0000 | ORAL_TABLET | Freq: Every day | ORAL | Status: AC

## 2023-06-30 NOTE — Addendum Note (Signed)
 Addended by: Adelfa Adolph on: 06/30/2023 09:52 AM   Modules accepted: Orders

## 2023-06-30 NOTE — Progress Notes (Signed)
 Office Visit  Subjective   Patient ID: [name] [name]   DOB: Feb 04, 1960   Age: 62 y.o.   MRN: [id number]   Chief Complaint Chief Complaint  Patient presents with   Urinary Frequency    Pt reports a week ago she started having urinary frequency, dysuria, and lower abdominal pain. The patient denies vaginal discharge.      History of Present Illness [name] [name] returns today for an acute visit for urinary complaints.  She states her symptoms started a week ago where she is having urinary frequency and pressure over her bladder.  There is also some dysuria but no fevers, chills, nausea, vomiting, no new back pain, hematuria or other problems.  She has not taken anything OTC for her symptoms.     Past Medical History Past Medical History:  Diagnosis Date   Anxiety    on meds   Cellulitis of left lower extremity 06/21/2017   Hyperlipidemia 06/21/2017   on meds   Hypertension    on meds   Osteoarthritis      Allergies No Known Allergies   Medications  Current Outpatient Medications:    ALPRAZolam  (XANAX ) 0.5 MG tablet, Take 1 tablet (0.5 mg total) by mouth daily as needed for anxiety., Disp: 30 tablet, Rfl: 2   Ascorbic Acid (VITAMIN C PO), Take 1 tablet by mouth daily at 6 (six) AM., Disp: , Rfl:    atenolol  (TENORMIN ) 25 MG tablet, Take 1 tablet (25 mg total) by mouth daily., Disp: 90 tablet, Rfl: 1   atorvastatin  (LIPITOR) 80 MG tablet, Take 1 tablet (80 mg total) by mouth daily., Disp: 90 tablet, Rfl: 1   buPROPion  (WELLBUTRIN  XL) 150 MG 24 hr tablet, TAKE 1 TABLET BY MOUTH EVERY DAY, Disp: 90 tablet, Rfl: 1   busPIRone  (BUSPAR ) 30 MG tablet, Take 1 tablet (30 mg total) by mouth 2 (two) times daily., Disp: 180 tablet, Rfl: 0   celecoxib  (CELEBREX ) 200 MG capsule, Take 1 capsule (200 mg total) by mouth daily., Disp: 180 capsule, Rfl: 3   escitalopram  (LEXAPRO ) 20 MG tablet, TAKE 1 TABLET BY MOUTH EVERY DAY, Disp: 90 tablet, Rfl: 1   furosemide  (LASIX ) 20 MG tablet, Take 1 tablet  (20 mg total) by mouth daily., Disp: 90 tablet, Rfl: 1   HYDROcodone -acetaminophen  (NORCO/VICODIN) 5-325 MG tablet, Take 0.5 tablets by mouth every 12 (twelve) hours as needed for moderate pain (pain score 4-6)., Disp: 30 tablet, Rfl: 0   levothyroxine  (SYNTHROID ) 25 MCG tablet, TAKE 1 TABLET BY MOUTH DAILY BEFORE BREAKFAST., Disp: 90 tablet, Rfl: 0   Multiple Vitamins-Minerals (ZINC PO), Take 1 tablet by mouth daily at 6 (six) AM., Disp: , Rfl:    potassium chloride  (KLOR-CON ) 10 MEQ tablet, Take 1 tablet (10 mEq total) by mouth daily., Disp: 30 tablet, Rfl: 2   rOPINIRole  (REQUIP ) 0.25 MG tablet, Take 1 tablet (0.25 mg total) by mouth at bedtime., Disp: 180 tablet, Rfl: 1   VITAMIN D PO, Take 1 tablet by mouth daily at 6 (six) AM., Disp: , Rfl:    Review of Systems Review of Systems  Constitutional:  Negative for chills and fever.  Genitourinary:  Positive for dysuria, frequency and urgency. Negative for flank pain and hematuria.  Musculoskeletal:  Negative for myalgias.  Skin:  Negative for rash.  Neurological:  Negative for dizziness, weakness and headaches.       Objective:    Vitals BP 128/82   Pulse 70   Temp 98.2 F (36.8 C) (Temporal)  Resp 18   Ht 5' 9 (1.753 m)   Wt 218 lb 9.6 oz (99.2 kg)   SpO2 99%   BMI 32.28 kg/m    Physical Examination Physical Exam Constitutional:      Appearance: Normal appearance. She is not ill-appearing.   Cardiovascular:     Rate and Rhythm: Normal rate and regular rhythm.     Pulses: Normal pulses.     Heart sounds: No murmur heard.    No friction rub. No gallop.  Pulmonary:     Effort: Pulmonary effort is normal. No respiratory distress.     Breath sounds: No wheezing, rhonchi or rales.  Abdominal:     General: Bowel sounds are normal. There is no distension.     Palpations: Abdomen is soft.     Tenderness: There is no abdominal tenderness.   Musculoskeletal:     Right lower leg: No edema.     Left lower leg: No edema.    Skin:    General: Skin is warm and dry.     Findings: No rash.   Neurological:     Mental Status: She is alert.        Assessment & Plan:   OAB (overactive bladder) Her UA is completely normal.  She is having some OAB and stress incontinence.  I gave her samples of gemtessa 75mg  po daily.  She is to contact us  in a month to see how she is doing.    No follow-ups on file.   Wayne Haines, MD

## 2023-06-30 NOTE — Assessment & Plan Note (Signed)
 Her UA is completely normal.  She is having some OAB and stress incontinence.  I gave her samples of gemtessa 75mg  po daily.  She is to contact us  in a month to see how she is doing.

## 2023-07-11 ENCOUNTER — Ambulatory Visit: Admitting: Internal Medicine

## 2023-07-13 ENCOUNTER — Other Ambulatory Visit: Payer: Self-pay | Admitting: Internal Medicine

## 2023-07-26 ENCOUNTER — Ambulatory Visit (INDEPENDENT_AMBULATORY_CARE_PROVIDER_SITE_OTHER): Admitting: Physician Assistant

## 2023-07-26 VITALS — BP 144/92 | HR 81 | Temp 98.1°F | Ht 69.0 in | Wt 220.0 lb

## 2023-07-26 DIAGNOSIS — F332 Major depressive disorder, recurrent severe without psychotic features: Secondary | ICD-10-CM | POA: Diagnosis not present

## 2023-07-26 DIAGNOSIS — F411 Generalized anxiety disorder: Secondary | ICD-10-CM

## 2023-07-26 MED ORDER — BUSPIRONE HCL 30 MG PO TABS: 30.0000 mg | ORAL_TABLET | Freq: Two times a day (BID) | ORAL | 1 refills | Status: DC

## 2023-07-26 MED ORDER — ESCITALOPRAM OXALATE 20 MG PO TABS
20.0000 mg | ORAL_TABLET | Freq: Every day | ORAL | 1 refills | Status: DC
Start: 2023-07-26 — End: 2023-09-07

## 2023-07-26 MED ORDER — BUPROPION HCL ER (XL) 150 MG PO TB24: 150.0000 mg | ORAL_TABLET | Freq: Every day | ORAL | 1 refills | Status: DC
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
## 2023-07-29 ENCOUNTER — Encounter (HOSPITAL_COMMUNITY): Payer: Self-pay | Admitting: Physician Assistant

## 2023-08-01 ENCOUNTER — Other Ambulatory Visit: Payer: Self-pay | Admitting: Internal Medicine

## 2023-08-01 MED ORDER — HYDROCODONE-ACETAMINOPHEN 5-325 MG PO TABS: 0.5000 | ORAL_TABLET | Freq: Two times a day (BID) | ORAL | 0 refills | Status: DC | PRN

## 2023-08-16 ENCOUNTER — Ambulatory Visit: Admitting: Internal Medicine

## 2023-08-21 ENCOUNTER — Encounter: Payer: Self-pay | Admitting: Internal Medicine

## 2023-08-21 ENCOUNTER — Ambulatory Visit: Admitting: Internal Medicine

## 2023-08-21 VITALS — BP 130/80 | HR 75 | Temp 97.8°F | Resp 16 | Ht 69.0 in | Wt 219.8 lb

## 2023-08-21 DIAGNOSIS — F331 Major depressive disorder, recurrent, moderate: Secondary | ICD-10-CM

## 2023-08-21 DIAGNOSIS — F411 Generalized anxiety disorder: Secondary | ICD-10-CM | POA: Diagnosis not present

## 2023-08-21 MED ORDER — HYDROCODONE-ACETAMINOPHEN 5-325 MG PO TABS: 0.5000 | ORAL_TABLET | Freq: Two times a day (BID) | ORAL | 0 refills | Status: AC | PRN

## 2023-08-21 NOTE — Progress Notes (Signed)
 Office Visit  Subjective   Patient ID: [name] [name]   DOB: January 06, 1961   Age: 63 y.o.   MRN: [id number]   Chief Complaint Chief Complaint  Patient presents with   Follow-up     History of Present Illness [name] [name] is a 63 yo female who returns today for followup of her memory loss as well as her depression and generalized anxiety.  I saw her several months ago and I felt she was having a severe episode of recurrent major depression.  Her anxiety was doing better and she was still talking to a counsellor at that time but over the interim, she is no longer talking to a Haematologist.  Several months ago, I did increase her lexapro  from 10mg  to 20mg  daily and continued her buspar  30mg  BID and asked her to use xanax  0.5mg  daily as needed.  She was using the xanax  maybe 1-2 times per week.  On her last visit in 05/2023, I felt her depression was not improved and we added Wellbutrin  XL 150mg  daily.  We did have her establish with a psychiatrist whom she saw for her initial evaluation on 07/26/2023.  They kept her on her same dose of medications and they wanted her to followup with Sahil Kapoor, MD on 09/07/2023.  Today, she states her depression and anxiety are both moderate.  Her last panic attack was 1 month ago.  She states that her memory is doing better over the last 2 months.  She is now ability for disability thru the state for her depression/anxiety.  I saw her at the beginning of the year for a fitness of duty evaluation for work.  She was noted to have difficulty with completing charting and beside tasks.  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  She admitted to me that she was forgetting to take her medications before thanksgiving and was taking them haphazardly before then and was forgetting to take her medicines.  She therefore just stopped her medications at Thanksgiving.  She told me that her memory was effecting her and this started  since her husband was in the hospital since 07/2021.  She complained of short term memory since then.   However, she tolds me that she felt overwhelmed and her depression had become worse.  She stated she felt like she was losing it.  She stated that her anxiety was worsened and she was crying all the time.  I saw her  in 10/2022 where we had started her on cymbalta  in 09/2022 as she had just stopped her lexapro  2 weeks before that visit.  I felt she had moderate depression at that time and again was having a lot of problems with anxiety with her husband's health.  She was previously on lexapro  10mg  daily and buspar  30mg  BID and xanax  as needed.  I saw her in followup on 10/2022 where she stated at that time that it did help a lot but she had some increased anxiety about a week prior and started taking cymbalta  30mg  2 tabs (60mg ) in AM.  I also started her on low dose trazodone  50mg  at night for insomnia at that time in 10/2022.  She has used xanax  prn which she has only had to use maybe one time over that month interval from 09/2022 until 10/2022.  Again, I saw her in 02/2022 where she had not taken any of her meds since Thanksgiving.  Her depression and anxiety were severe at that time.  She was having problems with concentration, feeling of guilt, hopelessness and worthlessness.  She was not eating well and was losing weight and was not sleeping well.  She was having loss of pleasurable activities.  She denied suicidal ideation and homicidal ideation. This patient feels that she was able to care for herself and her dependents. 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 She also takes care of her 82 year old grandson..She denies a recent life crisis. She currently lives with her family.  I felt that her worsening depression and anxiety was causing memory problems.  I stopped her cymbalta  and restarted her on lexapro  10mg  daily  and restarted her buspar  and trazodone .  I also referred her for counselling and to psychiatry.  I did not think she could do her job functions at that time and she needs a temporary leave of abscence until we got her depression and anxiety more controlled.  She still has problems with sleep and she is sleeping 3-4 hours due to pain. She states that she continues to have pain and this prevents her from sleeping.  She has not been taking trazodone .  She remains on lexapro  10mg  daily and buspar  30mg  BID.  She states her memory is doing better.  She was seeeing a psychologist/counsellor whom she was seeing once a week thru [facility] but over the interim, the patient states she stopped going as it was not helping.     Over the interim, she also has seen urology who is setting her up for urodynamic stuides.  Again, she was having problems with incontinence of bowel and bladder.  She states this occurred around 12/2022.  She has tried [employer] which has helped.  She looses control of her bladder and denies any urinary urgency or hesitancy.  She is on lasix  daily.       Past Medical History Past Medical History:  Diagnosis Date   Anxiety    on meds   Cellulitis of left lower extremity 06/21/2017   Hyperlipidemia 06/21/2017   on meds   Hypertension    on meds   Osteoarthritis      Allergies No Known Allergies   Medications  Current Outpatient Medications:    ALPRAZolam  (XANAX ) 0.5 MG tablet, Take 1 tablet (0.5 mg total) by mouth daily as needed for anxiety., Disp: 30 tablet, Rfl: 2   Ascorbic Acid (VITAMIN C PO), Take 1 tablet by mouth daily at 6 (six) AM., Disp: , Rfl:    atenolol  (TENORMIN ) 25 MG tablet, Take 1 tablet (25 mg total) by mouth daily., Disp: 90 tablet, Rfl: 1   atorvastatin  (LIPITOR) 80 MG tablet, Take 1 tablet (80 mg total) by mouth daily., Disp: 90 tablet, Rfl: 1   buPROPion  (WELLBUTRIN  XL) 150 MG 24 hr tablet, Take 1 tablet (150 mg total) by mouth daily., Disp: 90  tablet, Rfl: 1   busPIRone  (BUSPAR ) 30 MG tablet, Take 1 tablet (30 mg total) by mouth 2 (two) times daily., Disp: 180 tablet, Rfl: 1   celecoxib  (CELEBREX ) 200 MG capsule, Take 1 capsule (200 mg total) by mouth daily., Disp: 180 capsule, Rfl: 3   escitalopram  (LEXAPRO ) 20 MG tablet, Take 1 tablet (20 mg total) by mouth daily., Disp: 90 tablet, Rfl: 1   furosemide  (LASIX ) 20 MG tablet, Take 1 tablet (20 mg total) by mouth  daily., Disp: 90 tablet, Rfl: 1   HYDROcodone -acetaminophen  (NORCO/VICODIN) 5-325 MG tablet, Take 0.5 tablets by mouth every 12 (twelve) hours as needed for moderate pain (pain score 4-6)., Disp: 30 tablet, Rfl: 0   levothyroxine  (SYNTHROID ) 25 MCG tablet, TAKE 1 TABLET BY MOUTH DAILY BEFORE BREAKFAST., Disp: 90 tablet, Rfl: 0   Multiple Vitamins-Minerals (ZINC PO), Take 1 tablet by mouth daily at 6 (six) AM., Disp: , Rfl:    potassium chloride  (KLOR-CON ) 10 MEQ tablet, TAKE 1 TABLET BY MOUTH EVERY DAY, Disp: 90 tablet, Rfl: 1   rOPINIRole  (REQUIP ) 0.25 MG tablet, Take 1 tablet (0.25 mg total) by mouth at bedtime., Disp: 180 tablet, Rfl: 1   Vibegron  (GEMTESA ) 75 MG TABS, Take 1 tablet (75 mg total) by mouth daily., Disp: , Rfl:    VITAMIN D PO, Take 1 tablet by mouth daily at 6 (six) AM., Disp: , Rfl:    Review of Systems Review of Systems  Constitutional:  Negative for chills and fever.  Eyes:  Negative for blurred vision and double vision.  Respiratory:  Negative for shortness of breath.   Cardiovascular:  Negative for chest pain, palpitations and leg swelling.  Gastrointestinal:  Negative for abdominal pain, constipation, diarrhea, nausea and vomiting.  Neurological:  Negative for dizziness, weakness and headaches.  Psychiatric/Behavioral:  Negative for substance abuse and suicidal ideas.        Objective:    Vitals BP 130/80 (BP Location: Left Arm, Patient Position: Sitting)   Pulse 75   Temp 97.8 F (36.6 C)   Resp 16   Ht 5' 9 (1.753 m)   Wt 219 lb 12.8 oz  (99.7 kg)   SpO2 97%   BMI 32.46 kg/m    Physical Examination Physical Exam Constitutional:      Appearance: Normal appearance. She is not ill-appearing.  Cardiovascular:     Rate and Rhythm: Normal rate and regular rhythm.     Pulses: Normal pulses.     Heart sounds: No murmur heard.    No friction rub. No gallop.  Pulmonary:     Effort: Pulmonary effort is normal. No respiratory distress.     Breath sounds: No wheezing, rhonchi or rales.  Abdominal:     General: Bowel sounds are normal. There is no distension.     Palpations: Abdomen is soft.     Tenderness: There is no abdominal tenderness.  Musculoskeletal:     Right lower leg: No edema.     Left lower leg: No edema.  Skin:    General: Skin is warm and dry.     Findings: No rash.  Neurological:     Mental Status: She is alert.        Assessment & Plan:   Moderate episode of recurrent major depressive disorder [facility]) I reviewed her evaluation by psychiatry.  They have not changed any of her meds.  There is some improvement in her memory.  Psychiatry will take over for her medications at this time.    Generalized anxiety disorder She is to only use her xanax  as needed.  I will see her back in 3 months for a yearly exam.    Return in about 3 months (around 11/21/2023) for annual.   Selinda Fleeta Finger, MD

## 2023-08-21 NOTE — Assessment & Plan Note (Signed)
 She is to only use her xanax  as needed.  I will see her back in 3 months for a yearly exam.

## 2023-08-21 NOTE — Assessment & Plan Note (Signed)
 I reviewed her evaluation by psychiatry.  They have not changed any of her meds.  There is some improvement in her memory.  Psychiatry will take over for her medications at this time.

## 2023-08-24 NOTE — Progress Notes (Unsigned)
 BH MD/PA/NP OP Progress Note  08/24/2023 10:29 AM [name] [name]  MRN:  [id number]  Visit Diagnosis: No diagnosis found.  Assessment:  [name] [name] is a 63 year old female with a past psychiatric history significant for major depressive disorder (severe episode, recurrent, without psychotic features) and generalized anxiety disorder who presents to [facility] to establish psychiatric care and for medication management.   Patient presents to the encounter stating that her primary care provider would like for her to discuss her medication regimen with a psychiatric provider.  Patient is currently being managed on the following psychiatric medications:   Buspirone  30 mg 2 times daily Lexapro  20 mg daily Wellbutrin  XL 150 mg daily Xanax  0.5 mg daily  Overall, patient reports that her medications have been helpful in managing her symptoms, especially her anxiety.  She still continues to endorse days where her depression is debilitating.  She also endorses occasional anxiety due to stressors in her life.  A PHQ-9 screen was performed with the patient scoring of 22.  A GAD-7 screen was also performed with the patient scoring a 19. Despite experiencing ongoing depression and anxiety, patient would like to continue taking her medications as prescribed.  Patient's medications to be e-prescribed to pharmacy of choice.  Risk Assessment: An assessment of suicide and violence risk factors was performed as part of this evaluation and is not  significantly changed from the last visit. While future psychiatric events cannot be accurately predicted, the patient does not  currently require acute inpatient psychiatric care and does not  currently meet [place]  involuntary commitment criteria. Patient was given contact information for crisis resources, behavioral health clinic and was instructed to call 911 for emergencies.   [name] [name] presents for follow-up  evaluation. Today, 08/24/23, patient reports ***  1. Severe episode of recurrent major depressive disorder, without psychotic features (HCC) (Primary)   - buPROPion  (WELLBUTRIN  XL) 150 MG 24 hr tablet; Take 1 tablet (150 mg total) by mouth daily.  Dispense: 90 tablet; Refill: 1 - escitalopram  (LEXAPRO ) 20 MG tablet; Take 1 tablet (20 mg total) by mouth daily.  Dispense: 90 tablet; Refill: 1   2. Generalized anxiety disorder   - escitalopram  (LEXAPRO ) 20 MG tablet; Take 1 tablet (20 mg total) by mouth daily.  Dispense: 90 tablet; Refill: 1 - busPIRone  (BUSPAR ) 30 MG tablet; Take 1 tablet (30 mg total) by mouth 2 (two) times daily.  Dispense: 180 tablet; Refill: 1 Chief Complaint: No chief complaint on file.  HPI:  [name] [name] is a 63 year old female with a past psychiatric history significant for major depressive disorder (severe episode, recurrent, without psychotic features) and generalized anxiety disorder who presents to [facility] to establish psychiatric care and for medication management.   Patient presents to the encounter stating that she is on a bunch of psychiatric medications and her primary care provider wants her to go over her medication regimen with a psychiatric provider.  Patient is currently being managed on the following psychiatric medications:   Buspirone  30 mg 2 times daily Wellbutrin  XL 150 mg daily Lexapro  20 mg daily   Patient reports that her current medication regimen has been helpful. She reports that her medications have been helpful with managing her anxiety. Despite her medications being helpful, patient reports that there are days that are better than others in regards to her mood.  She reports that there are some days where she will lay in bed and cry but reports  that when she originally lost her job, she was crying every day.  In regards to her stressors, patient reports that she recently lost her job in December and  she is currently in the process of applying for [employer].   Patient endorses a family history of depression and rates her depression a 5 out of 10 with 10 being most severe.  Patient endorses depressive episodes almost every day.  She reports that there are a couple of days during the week where she will not get out of bed.  Patient endorses the following depressive symptoms: feelings of sadness,crying spells, decreased energy, neglecting activities of daily living, lack of motivation, irritability (improving), feelings of guilt/worthlessness, hopelessness.  Worsening factors to her depression include her ruminating thoughts.  Her depression is alleviated by the following factors: coping skills and breathing exercises.   Patient endorses anxiety and rates her anxiety as 4 out of 10.  She reports that her anxiety is often accompanied by nervousness, restlessness, and muscle tension.  Patient endorses the following stressors: having to take care of her husband, ongoing arthritis, and being in chronic pain.  Patient endorses a past history of panic attacks but states that she has not had an episode in a while.  She reports that she last experienced a panic attack back in April.  Patient's panic attacks are characterized by the following symptoms: shortness of breath, elevated heart rate, feeling flushed, and hyperventilation.   Patient is alert and oriented x 4, calm, cooperative, and fully engaged in conversation during the encounter.  Patient endorses anxious mood.  Patient exhibits depressed mood with anxious affect.  Patient denies suicidal or homicidal ideations.  She further denies auditory or visual hallucinations and does not appear to be responding to internal/external stimuli.  Patient denies paranoia or delusional thoughts.  Patient endorses fair sleep and receives on average 5 to 6 hours of sleep per night patient endorses fair appetite and may have at least 1 big meal per day as well as  snacking throughout the day.  Patient denies alcohol consumption.  Patient endorses tobacco use and smokes on average a pack per day.  Patient denies illicit drug use.   Past Psychiatric History:  Patient has a past psychiatric history significant for major depressive disorder and generalized anxiety disorder.   Patient denies a past history of hospitalization due to mental health.   Patient endorses a past history of suicide attempt stating that she had an accidental overdose.  Patient reports that she participated in group therapy sessions following her attempt.   Patient denies a past history of homicide attempts.  Past Medical History:  Past Medical History:  Diagnosis Date   Anxiety    on meds   Cellulitis of left lower extremity 06/21/2017   Hyperlipidemia 06/21/2017   on meds   Hypertension    on meds   Osteoarthritis     Past Surgical History:  Procedure Laterality Date   LAPAROSCOPIC CHOLECYSTECTOMY  2010   TUBAL LIGATION  1986    Family Psychiatric History: Mother - past history of nervous breakdown, hospitalized due to past suicide attempt Brother (deceased) - major depressive disorder, history of suicide attempt  Family History:  Family History  Problem Relation Age of Onset   Diabetes Mother    Liver disease Mother 54       NAFLD-   Colon polyps Father 63   Colon cancer Father 57   Colon polyps Brother 33   Esophageal cancer Neg Hx  Rectal cancer Neg Hx    Stomach cancer Neg Hx     Social History:  Social History   Socioeconomic History   Marital status: Married    Spouse name: Not on file   Number of children: Not on file   Years of education: Not on file   Highest education level: Not on file  Occupational History   Not on file  Tobacco Use   Smoking status: Every Day    Current packs/day: 1.00    Average packs/day: 1 pack/day for 40.0 years (40.0 ttl pk-yrs)    Types: Cigarettes   Smokeless tobacco: Never  Vaping Use   Vaping status:  Never Used  Substance and Sexual Activity   Alcohol use: Not Currently   Drug use: Never   Sexual activity: Not Currently  Other Topics Concern   Not on file  Social History Narrative   Not on file   Social Drivers of Health   Financial Resource Strain: Not on file  Food Insecurity: Not on file  Transportation Needs: Not on file  Physical Activity: Not on file  Stress: Not on file  Social Connections: Not on file    Allergies: No Known Allergies  Current Medications: Current Outpatient Medications  Medication Sig Dispense Refill   ALPRAZolam  (XANAX ) 0.5 MG tablet Take 1 tablet (0.5 mg total) by mouth daily as needed for anxiety. 30 tablet 2   Ascorbic Acid (VITAMIN C PO) Take 1 tablet by mouth daily at 6 (six) AM.     atenolol  (TENORMIN ) 25 MG tablet Take 1 tablet (25 mg total) by mouth daily. 90 tablet 1   atorvastatin  (LIPITOR) 80 MG tablet Take 1 tablet (80 mg total) by mouth daily. 90 tablet 1   buPROPion  (WELLBUTRIN  XL) 150 MG 24 hr tablet Take 1 tablet (150 mg total) by mouth daily. 90 tablet 1   busPIRone  (BUSPAR ) 30 MG tablet Take 1 tablet (30 mg total) by mouth 2 (two) times daily. 180 tablet 1   celecoxib  (CELEBREX ) 200 MG capsule Take 1 capsule (200 mg total) by mouth daily. 180 capsule 3   escitalopram  (LEXAPRO ) 20 MG tablet Take 1 tablet (20 mg total) by mouth daily. 90 tablet 1   furosemide  (LASIX ) 20 MG tablet Take 1 tablet (20 mg total) by mouth daily. 90 tablet 1   HYDROcodone -acetaminophen  (NORCO/VICODIN) 5-325 MG tablet Take 0.5 tablets by mouth every 12 (twelve) hours as needed for moderate pain (pain score 4-6). 30 tablet 0   levothyroxine  (SYNTHROID ) 25 MCG tablet TAKE 1 TABLET BY MOUTH DAILY BEFORE BREAKFAST. 90 tablet 0   Multiple Vitamins-Minerals (ZINC PO) Take 1 tablet by mouth daily at 6 (six) AM.     potassium chloride  (KLOR-CON ) 10 MEQ tablet TAKE 1 TABLET BY MOUTH EVERY DAY 90 tablet 1   rOPINIRole  (REQUIP ) 0.25 MG tablet Take 1 tablet (0.25 mg  total) by mouth at bedtime. 180 tablet 1   Vibegron  (GEMTESA ) 75 MG TABS Take 1 tablet (75 mg total) by mouth daily.     VITAMIN D PO Take 1 tablet by mouth daily at 6 (six) AM.     No current facility-administered medications for this visit.     Musculoskeletal: Strength & Muscle Tone: {desc; muscle tone:32375} Gait & Station: {PE GAIT ED WJUO:77474} Patient leans: {Patient Leans:21022755}  Psychiatric Specialty Exam: There were no vitals taken for this visit.There is no height or weight on file to calculate BMI. Review of Systems  General Appearance: {Appearance:22683}  Eye Contact:  {  BHH EYE CONTACT:22684}  Speech:  {Speech:22685}  Volume:  {Volume (PAA):22686}  Mood:  {BHH MOOD:22306}  Affect:  {Affect (PAA):22687}  Thought Content: {Thought Content:22690}   Suicidal Thoughts:  {ST/HT (PAA):22692}  Homicidal Thoughts:  {ST/HT (PAA):22692}  Thought Process:  {Thought Process (PAA):22688}  Orientation:  {BHH ORIENTATION (PAA):22689}    Memory: {BHH MEMORY:22881}  Judgment:  {Judgement (PAA):22694}  Insight:  {Insight (PAA):22695}  Concentration:  {Concentration:21399}  Recall:  not formally assessed ***  Fund of Knowledge: {BHH GOOD/FAIR/POOR:22877}  Language: {BHH GOOD/FAIR/POOR:22877}  Psychomotor Activity:  {Psychomotor (PAA):22696}  Akathisia:  {BHH YES OR NO:22294}  AIMS (if indicated): {Desc; done/not:10129}  Assets:  {Assets (PAA):22698}  ADL's:  {BHH JIO'D:77709}  Cognition: {chl bhh cognition:304700322}  Sleep:  {BHH GOOD/FAIR/POOR:22877}   Metabolic Disorder Labs: Lab Results  Component Value Date   HGBA1C 5.4 06/21/2017   MPG 108.28 06/21/2017   No results found for: PROLACTIN Lab Results  Component Value Date   CHOL 147 08/01/2022   TRIG 76 08/01/2022   HDL 63 08/01/2022   CHOLHDL 2.3 08/01/2022   VLDL 20 06/21/2017   LDLCALC 69 08/01/2022   LDLCALC 153 (H) 06/21/2017   Lab Results  Component Value Date   TSH 2.270 01/27/2023   TSH 0.918  11/16/2022    Therapeutic Level Labs: No results found for: LITHIUM No results found for: VALPROATE No results found for: CBMZ   Screenings: GAD-7    Flowsheet Row Office Visit from 07/26/2023 in [facility]  Total GAD-7 Score 19   PHQ2-9    Flowsheet Row Office Visit from 07/26/2023 in [facility] Office Visit from 03/27/2023 in [facility] Office Visit from 08/01/2022 in [facility] Office Visit from 07/28/2022 in [facility]  PHQ-2 Total Score 6 6 2  0  PHQ-9 Total Score 22 26 13  --   Flowsheet Row Office Visit from 07/26/2023 in [facility] from [facility]2021 [facility])  C-SSRS RISK CATEGORY Moderate Risk No Risk    Collaboration of Care: Collaboration of Care: [facility] OP Collaboration of Care:[id number]}  Patient/Guardian was advised Release of Information must be obtained prior to any record release in order to collaborate their care with an outside provider. Patient/Guardian was advised if they have not already done so to contact the registration department to sign all necessary forms in order for us  to release information regarding their care.   Consent: Patient/Guardian gives verbal consent for treatment and assignment of benefits for services provided during this visit. Patient/Guardian expressed understanding and agreed to proceed.    Baldomero Mirarchi, MD 08/24/2023, 10:29 AM

## 2023-09-01 ENCOUNTER — Other Ambulatory Visit: Payer: Self-pay | Admitting: Internal Medicine

## 2023-09-07 ENCOUNTER — Ambulatory Visit (INDEPENDENT_AMBULATORY_CARE_PROVIDER_SITE_OTHER)

## 2023-09-07 VITALS — BP 129/75 | HR 91 | Ht 69.0 in | Wt 212.0 lb

## 2023-09-07 DIAGNOSIS — F332 Major depressive disorder, recurrent severe without psychotic features: Secondary | ICD-10-CM

## 2023-09-07 MED ORDER — ESCITALOPRAM OXALATE 10 MG PO TABS: 10.0000 mg | ORAL_TABLET | Freq: Every day | ORAL | 1 refills | Status: DC

## 2023-09-11 ENCOUNTER — Ambulatory Visit: Admitting: Internal Medicine

## 2023-09-22 NOTE — Progress Notes (Deleted)
 BH MD/PA/NP OP Progress Note  09/22/2023 11:03 AM [name] [name]  MRN:  [id number]  Visit Diagnosis:  No diagnosis found.   Assessment:  [name] [name] is a 63 year old female with a past psychiatric history significant for major depressive disorder (severe episode, recurrent, without psychotic features) and generalized anxiety disorder who presents to [facility] for medication management and follow-up.  She is being managed on Lexapro  and bupropion  for mood, buspirone  for anxiety and Xanax  as needed for anxiety by her primary care provider.  She reported compliance with her medications, denies any side effects.  She reports an improvement in her mood and anxiety and depression.  Reports sleep is affected by pain but overall good, has been eating less.  She is denying any active or passive SI/HI/AVH.  She has not of psychosocial stressors that have been contributing to her mood that includes getting off work, her has been being disabled, financial stressors, and multiple doctors appointments which she get overwhelmed about.  She is getting panic attacks once a month that she relates to increased sweating, increased heart rate and she often have to breathe in and out to relieve the stress.  She is currently not undergoing therapy, encouraged and provided resources. Discussed medications she stated that she only takes Xanax  sometimes at night to sleep, did not take it in the past 1 month and has 3 bottles of pills remaining.  Reported compliance on Lexapro , buspirone  and bupropion , denied any side effects.  Discussed about decreasing her dose of Lexapro  10 mg daily and continue rest of the same medications, patient amenable to the plan.  Risks benefits and side effects of all the medications were discussed.  The decreased dose of Lexapro  10 mg was sent to the preferred pharmacy, plan to follow up in 4 weeks.    Risk Assessment: An assessment of suicide and violence risk  factors was performed as part of this evaluation and is not  significantly changed from the last visit. While future psychiatric events cannot be accurately predicted, the patient does not  currently require acute inpatient psychiatric care and does not  currently meet [place]  involuntary commitment criteria. Patient was given contact information for crisis resources, behavioral health clinic and was instructed to call 911 for emergencies.   1. MDD currently in remission --Continue bupropion  150 mg XL daily --Decrease Lexapro  to 10 mg daily  2. Generalized anxiety disorder --Decrease Lexapro  to 10 mg daily --Continue buspirone  30 mg twice daily --Continue Xanax  0.5 mg as needed, currently being managed by primary care provider  3.  History of alcohol use disorder in remission -- Continue abstinence  Chief Complaint:  No chief complaint on file.  HPI:  [name] [name] is a 63 year old female with a past psychiatric history significant for major depressive disorder (severe episode, recurrent, without psychotic features) and generalized anxiety disorder who presents to [facility]  for a follow-up appointment. Today, the patient presented to the clinic unaccompanied.  She reported her mood as up and down.  She denied any active or passive SI/HI/AVH.  Reported her sleep as pretty good, reports that her pain is affecting her sleep, reports low appetite but states that she eats one big meal in a day and often snacks in between.  She has recently lost about 50 pounds in any year.  Reported that she often gets panic attacks, last panic attack was this morning due to psychosocial stressors that includes coming out of the  house, attending appointments, my husband is disabled .  Reports that she gets panic attack maybe once a month and she often does deep breathing to resolve.  She relates her mood to mostly due to the psychosocial stressors I get  overwhelmed by appointments, and my husband has sepsis.  Reported that she recently applied for Social Security disability which will take an year to improve.  Reported that her anxiety and depression are better now, has 2 days a week where she does not have the energy to get out of the bed.  Reported that anxiety is good and only few days or audible maybe it is not depression .  She denied using any substances including alcohol or cigarettes, reported abstinence from alcohol over the past 5 years. Discussed about therapy options she stated that I do not like going back to the time, had a lot of trauma as a child .  Was encouraged to take therapy resources and start again, patient amenable.  She stated that I miss my job . Discussed medications she stated that she only takes Xanax  sometimes at night to sleep, did not take it in the past 1 month and has 3 bottles of pills remaining.  Reported compliance on Lexapro , buspirone  and bupropion , denied any side effects.  Discussed about decreasing her dose of Lexapro  10 mg daily and continue rest of the same medications, patient amenable to the plan.  Risks benefits and side effects of all the medications were discussed.  The decreased dose of Lexapro  10 mg was sent to the preferred pharmacy, plan to follow up in 4 weeks.  Past Psychiatric History:  Patient has a past psychiatric history significant for major depressive disorder and generalized anxiety disorder. Patient denies a past history of hospitalization due to mental health.  Patient endorses a past history of suicide attempt stating that she had an accidental overdose.  Patient reports that she participated in group therapy sessions following her attempt. Patient denies a past history of homicide attempts.  Past Medical History:  Past Medical History:  Diagnosis Date   Anxiety    on meds   Cellulitis of left lower extremity 06/21/2017   Hyperlipidemia 06/21/2017   on meds   Hypertension    on  meds   Osteoarthritis     Past Surgical History:  Procedure Laterality Date   LAPAROSCOPIC CHOLECYSTECTOMY  2010   TUBAL LIGATION  1986    Family Psychiatric History: Mother - past history of nervous breakdown, hospitalized due to past suicide attempt Brother (deceased) - major depressive disorder, history of suicide attempt  Family History:  Family History  Problem Relation Age of Onset   Diabetes Mother    Liver disease Mother 24       NAFLD-   Colon polyps Father 61   Colon cancer Father 41   Colon polyps Brother 45   Esophageal cancer Neg Hx    Rectal cancer Neg Hx    Stomach cancer Neg Hx     Social History:  Social History   Socioeconomic History   Marital status: Married    Spouse name: Not on file   Number of children: Not on file   Years of education: Not on file   Highest education level: Not on file  Occupational History   Not on file  Tobacco Use   Smoking status: Every Day    Current packs/day: 1.00    Average packs/day: 1 pack/day for 40.0 years (40.0 ttl pk-yrs)  Types: Cigarettes   Smokeless tobacco: Never  Vaping Use   Vaping status: Never Used  Substance and Sexual Activity   Alcohol use: Not Currently   Drug use: Never   Sexual activity: Not Currently  Other Topics Concern   Not on file  Social History Narrative   Not on file   Social Drivers of Health   Financial Resource Strain: Not on file  Food Insecurity: Not on file  Transportation Needs: Not on file  Physical Activity: Not on file  Stress: Not on file  Social Connections: Not on file    Allergies: No Known Allergies  Current Medications: Current Outpatient Medications  Medication Sig Dispense Refill   ALPRAZolam  (XANAX ) 0.5 MG tablet Take 1 tablet (0.5 mg total) by mouth daily as needed for anxiety. 30 tablet 2   Ascorbic Acid (VITAMIN C PO) Take 1 tablet by mouth daily at 6 (six) AM.     atenolol  (TENORMIN ) 25 MG tablet Take 1 tablet (25 mg total) by mouth daily. 90  tablet 1   atorvastatin  (LIPITOR) 80 MG tablet Take 1 tablet (80 mg total) by mouth daily. 90 tablet 1   buPROPion  (WELLBUTRIN  XL) 150 MG 24 hr tablet Take 1 tablet (150 mg total) by mouth daily. 90 tablet 1   busPIRone  (BUSPAR ) 30 MG tablet Take 1 tablet (30 mg total) by mouth 2 (two) times daily. 180 tablet 1   celecoxib  (CELEBREX ) 200 MG capsule Take 1 capsule (200 mg total) by mouth daily. 180 capsule 3   escitalopram  (LEXAPRO ) 10 MG tablet Take 1 tablet (10 mg total) by mouth daily. 30 tablet 1   furosemide  (LASIX ) 20 MG tablet Take 1 tablet (20 mg total) by mouth daily. 90 tablet 1   HYDROcodone -acetaminophen  (NORCO/VICODIN) 5-325 MG tablet Take 0.5 tablets by mouth every 12 (twelve) hours as needed for moderate pain (pain score 4-6). 30 tablet 0   levothyroxine  (SYNTHROID ) 25 MCG tablet TAKE 1 TABLET BY MOUTH DAILY BEFORE BREAKFAST. 90 tablet 0   Multiple Vitamins-Minerals (ZINC PO) Take 1 tablet by mouth daily at 6 (six) AM.     potassium chloride  (KLOR-CON ) 10 MEQ tablet TAKE 1 TABLET BY MOUTH EVERY DAY 90 tablet 1   rOPINIRole  (REQUIP ) 0.25 MG tablet Take 1 tablet (0.25 mg total) by mouth at bedtime. 180 tablet 1   Vibegron  (GEMTESA ) 75 MG TABS Take 1 tablet (75 mg total) by mouth daily.     VITAMIN D PO Take 1 tablet by mouth daily at 6 (six) AM.     No current facility-administered medications for this visit.     Musculoskeletal: Strength & Muscle Tone: within normal limits Gait & Station: normal Patient leans: N/A  Psychiatric Specialty Exam: There were no vitals taken for this visit.There is no height or weight on file to calculate BMI. Review of Systems  Constitutional:  Negative for activity change, appetite change, chills, diaphoresis and fatigue.  HENT:  Negative for congestion, dental problem, drooling, ear discharge and ear pain.   Eyes:  Negative for pain, discharge and itching.  Respiratory:  Negative for apnea, cough, choking and chest tightness.   Cardiovascular:   Negative for chest pain, palpitations and leg swelling.  Gastrointestinal:  Negative for abdominal distention, abdominal pain, constipation, diarrhea and nausea.  Endocrine: Negative for cold intolerance and heat intolerance.  Genitourinary:  Negative for difficulty urinating, dysuria, flank pain, frequency, hematuria and urgency.  Musculoskeletal:  Positive for arthralgias, back pain, joint swelling and myalgias. Negative for gait  problem and neck pain.  Skin:  Negative for color change and pallor.  Allergic/Immunologic: Negative for environmental allergies and food allergies.  Neurological:  Negative for dizziness, seizures, syncope, facial asymmetry, speech difficulty, light-headedness, numbness and headaches.  Psychiatric/Behavioral:  Negative for agitation, behavioral problems, confusion, decreased concentration, dysphoric mood, hallucinations, self-injury, sleep disturbance and suicidal ideas. The patient is not nervous/anxious and is not hyperactive.     General Appearance: Casual  Eye Contact:  Good  Speech:  Clear and Coherent and Normal Rate  Volume:  Normal  Mood:  Euthymic  Affect:  Appropriate  Thought Content: Logical   Suicidal Thoughts:  No  Homicidal Thoughts:  No  Thought Process:  Linear  Orientation:  Full (Time, Place, and Person)    Memory: Immediate;   Fair Recent;   Fair Remote;   Fair  Judgment:  Fair  Insight:  Fair  Concentration:  Concentration: Good and Attention Span: Good  Recall:  not formally assessed   Fund of Knowledge: Good  Language: Good  Psychomotor Activity:  Normal  Akathisia:  No  AIMS (if indicated): not done  Assets:  Communication Skills Desire for Improvement Financial Resources/Insurance Housing Intimacy Leisure Time  ADL's:  Intact  Cognition: WNL  Sleep:  Fair   Metabolic Disorder Labs: Lab Results  Component Value Date   HGBA1C 5.4 06/21/2017   MPG 108.28 06/21/2017   No results found for: PROLACTIN Lab Results   Component Value Date   CHOL 147 08/01/2022   TRIG 76 08/01/2022   HDL 63 08/01/2022   CHOLHDL 2.3 08/01/2022   VLDL 20 06/21/2017   LDLCALC 69 08/01/2022   LDLCALC 153 (H) 06/21/2017   Lab Results  Component Value Date   TSH 2.270 01/27/2023   TSH 0.918 11/16/2022    Therapeutic Level Labs: No results found for: LITHIUM No results found for: VALPROATE No results found for: CBMZ   Screenings: GAD-7    Flowsheet Row Office Visit from 07/26/2023 in [facility]  Total GAD-7 Score 19   PHQ2-9    Flowsheet Row Clinical Support from 09/07/2023 in [facility] Office Visit from 07/26/2023 in [facility] Office Visit from 03/27/2023 in [facility] Office Visit from 08/01/2022 in [facility] Office Visit from 07/28/2022 in [facility]  PHQ-2 Total Score 2 6 6 2  0  PHQ-9 Total Score 6 22 26 13  --   Flowsheet Row Office Visit from 07/26/2023 in [facility] from 09/17/2021 in [facility])  C-SSRS RISK CATEGORY Moderate Risk No Risk    Collaboration of Care: Collaboration of Care: Other Dr. Susen  Patient/Guardian was advised Release of Information must be obtained prior to any record release in order to collaborate their care with an outside provider. Patient/Guardian was advised if they have not already done so to contact the registration department to sign all necessary forms in order for us  to release information regarding their care.   Consent: Patient/Guardian gives verbal consent for treatment and assignment of benefits for services provided during this visit. Patient/Guardian expressed understanding and agreed to proceed.    Mattye Verdone, MD 09/22/2023, 11:03 AM

## 2023-09-30 ENCOUNTER — Other Ambulatory Visit: Payer: Self-pay | Admitting: Internal Medicine

## 2023-10-05 ENCOUNTER — Encounter (HOSPITAL_COMMUNITY)

## 2023-10-05 NOTE — Progress Notes (Deleted)
 BH MD/PA/NP OP Progress Note  10/05/2023 9:37 AM [name] [name]  MRN:  [id number]  Visit Diagnosis:  No diagnosis found.   Assessment:  [name] [name] is a 64 year old female with a past psychiatric history significant for major depressive disorder (severe episode, recurrent, without psychotic features) and generalized anxiety disorder who presents to [facility] for medication management and follow-up.  She is being managed on Lexapro  and bupropion  for mood, buspirone  for anxiety and Xanax  as needed for anxiety by her primary care provider.  She reported compliance with her medications, denies any side effects.  She reports an improvement in her mood and anxiety and depression.  Reports sleep is affected by pain but overall good, has been eating less.  She is denying any active or passive SI/HI/AVH.  She has not of psychosocial stressors that have been contributing to her mood that includes getting off work, her has been being disabled, financial stressors, and multiple doctors appointments which she get overwhelmed about.  She is getting panic attacks once a month that she relates to increased sweating, increased heart rate and she often have to breathe in and out to relieve the stress.  She is currently not undergoing therapy, encouraged and provided resources. Discussed medications she stated that she only takes Xanax  sometimes at night to sleep, did not take it in the past 1 month and has 3 bottles of pills remaining.  Reported compliance on Lexapro , buspirone  and bupropion , denied any side effects.  Discussed about decreasing her dose of Lexapro  10 mg daily and continue rest of the same medications, patient amenable to the plan.  Risks benefits and side effects of all the medications were discussed.  The decreased dose of Lexapro  10 mg was sent to the preferred pharmacy, plan to follow up in 4 weeks.    Risk Assessment: An assessment of suicide and violence risk  factors was performed as part of this evaluation and is not  significantly changed from the last visit. While future psychiatric events cannot be accurately predicted, the patient does not  currently require acute inpatient psychiatric care and does not  currently meet [place]  involuntary commitment criteria. Patient was given contact information for crisis resources, behavioral health clinic and was instructed to call 911 for emergencies.   1. MDD currently in remission --Continue bupropion  150 mg XL daily --Decrease Lexapro  to 10 mg daily  2. Generalized anxiety disorder --Decrease Lexapro  to 10 mg daily --Continue buspirone  30 mg twice daily --Continue Xanax  0.5 mg as needed, currently being managed by primary care provider  3.  History of alcohol use disorder in remission -- Continue abstinence  Chief Complaint:  No chief complaint on file.  HPI:  [name] [name] is a 63 year old female with a past psychiatric history significant for major depressive disorder (severe episode, recurrent, without psychotic features) and generalized anxiety disorder who presents to [facility]  for a follow-up appointment. Today, the patient presented to the clinic unaccompanied.  She reported her mood as up and down.  She denied any active or passive SI/HI/AVH.  Reported her sleep as pretty good, reports that her pain is affecting her sleep, reports low appetite but states that she eats one big meal in a day and often snacks in between.  She has recently lost about 50 pounds in any year.  Reported that she often gets panic attacks, last panic attack was this morning due to psychosocial stressors that includes coming out of the  house, attending appointments, my husband is disabled .  Reports that she gets panic attack maybe once a month and she often does deep breathing to resolve.  She relates her mood to mostly due to the psychosocial stressors I get  overwhelmed by appointments, and my husband has sepsis.  Reported that she recently applied for Social Security disability which will take an year to improve.  Reported that her anxiety and depression are better now, has 2 days a week where she does not have the energy to get out of the bed.  Reported that anxiety is good and only few days or audible maybe it is not depression .  She denied using any substances including alcohol or cigarettes, reported abstinence from alcohol over the past 5 years. Discussed about therapy options she stated that I do not like going back to the time, had a lot of trauma as a child .  Was encouraged to take therapy resources and start again, patient amenable.  She stated that I miss my job . Discussed medications she stated that she only takes Xanax  sometimes at night to sleep, did not take it in the past 1 month and has 3 bottles of pills remaining.  Reported compliance on Lexapro , buspirone  and bupropion , denied any side effects.  Discussed about decreasing her dose of Lexapro  10 mg daily and continue rest of the same medications, patient amenable to the plan.  Risks benefits and side effects of all the medications were discussed.  The decreased dose of Lexapro  10 mg was sent to the preferred pharmacy, plan to follow up in 4 weeks.  Past Psychiatric History:  Patient has a past psychiatric history significant for major depressive disorder and generalized anxiety disorder. Patient denies a past history of hospitalization due to mental health.  Patient endorses a past history of suicide attempt stating that she had an accidental overdose.  Patient reports that she participated in group therapy sessions following her attempt. Patient denies a past history of homicide attempts.  Past Medical History:  Past Medical History:  Diagnosis Date   Anxiety    on meds   Cellulitis of left lower extremity 06/21/2017   Hyperlipidemia 06/21/2017   on meds   Hypertension    on  meds   Osteoarthritis     Past Surgical History:  Procedure Laterality Date   LAPAROSCOPIC CHOLECYSTECTOMY  2010   TUBAL LIGATION  1986    Family Psychiatric History: Mother - past history of nervous breakdown, hospitalized due to past suicide attempt Brother (deceased) - major depressive disorder, history of suicide attempt  Family History:  Family History  Problem Relation Age of Onset   Diabetes Mother    Liver disease Mother 17       NAFLD-   Colon polyps Father 72   Colon cancer Father 109   Colon polyps Brother 75   Esophageal cancer Neg Hx    Rectal cancer Neg Hx    Stomach cancer Neg Hx     Social History:  Social History   Socioeconomic History   Marital status: Married    Spouse name: Not on file   Number of children: Not on file   Years of education: Not on file   Highest education level: Not on file  Occupational History   Not on file  Tobacco Use   Smoking status: Every Day    Current packs/day: 1.00    Average packs/day: 1 pack/day for 40.0 years (40.0 ttl pk-yrs)  Types: Cigarettes   Smokeless tobacco: Never  Vaping Use   Vaping status: Never Used  Substance and Sexual Activity   Alcohol use: Not Currently   Drug use: Never   Sexual activity: Not Currently  Other Topics Concern   Not on file  Social History Narrative   Not on file   Social Drivers of Health   Financial Resource Strain: Not on file  Food Insecurity: Not on file  Transportation Needs: Not on file  Physical Activity: Not on file  Stress: Not on file  Social Connections: Not on file    Allergies: No Known Allergies  Current Medications: Current Outpatient Medications  Medication Sig Dispense Refill   ALPRAZolam  (XANAX ) 0.5 MG tablet Take 1 tablet (0.5 mg total) by mouth daily as needed for anxiety. 30 tablet 2   Ascorbic Acid (VITAMIN C PO) Take 1 tablet by mouth daily at 6 (six) AM.     atenolol  (TENORMIN ) 25 MG tablet Take 1 tablet (25 mg total) by mouth daily. 90  tablet 1   atorvastatin  (LIPITOR) 80 MG tablet Take 1 tablet (80 mg total) by mouth daily. 90 tablet 1   buPROPion  (WELLBUTRIN  XL) 150 MG 24 hr tablet Take 1 tablet (150 mg total) by mouth daily. 90 tablet 1   busPIRone  (BUSPAR ) 30 MG tablet Take 1 tablet (30 mg total) by mouth 2 (two) times daily. 180 tablet 1   celecoxib  (CELEBREX ) 200 MG capsule Take 1 capsule (200 mg total) by mouth daily. 180 capsule 3   escitalopram  (LEXAPRO ) 10 MG tablet Take 1 tablet (10 mg total) by mouth daily. 30 tablet 1   furosemide  (LASIX ) 20 MG tablet Take 1 tablet (20 mg total) by mouth daily. 90 tablet 1   HYDROcodone -acetaminophen  (NORCO/VICODIN) 5-325 MG tablet Take 0.5 tablets by mouth every 12 (twelve) hours as needed for moderate pain (pain score 4-6). 30 tablet 0   levothyroxine  (SYNTHROID ) 25 MCG tablet TAKE 1 TABLET BY MOUTH DAILY BEFORE BREAKFAST. 90 tablet 0   Multiple Vitamins-Minerals (ZINC PO) Take 1 tablet by mouth daily at 6 (six) AM.     potassium chloride  (KLOR-CON ) 10 MEQ tablet TAKE 1 TABLET BY MOUTH EVERY DAY 90 tablet 1   rOPINIRole  (REQUIP ) 0.25 MG tablet Take 1 tablet (0.25 mg total) by mouth at bedtime. 180 tablet 1   Vibegron  (GEMTESA ) 75 MG TABS Take 1 tablet (75 mg total) by mouth daily.     VITAMIN D PO Take 1 tablet by mouth daily at 6 (six) AM.     No current facility-administered medications for this visit.     Musculoskeletal: Strength & Muscle Tone: within normal limits Gait & Station: normal Patient leans: N/A  Psychiatric Specialty Exam: There were no vitals taken for this visit.There is no height or weight on file to calculate BMI. Review of Systems  Constitutional:  Negative for activity change, appetite change, chills, diaphoresis and fatigue.  HENT:  Negative for congestion, dental problem, drooling, ear discharge and ear pain.   Eyes:  Negative for pain, discharge and itching.  Respiratory:  Negative for apnea, cough, choking and chest tightness.   Cardiovascular:   Negative for chest pain, palpitations and leg swelling.  Gastrointestinal:  Negative for abdominal distention, abdominal pain, constipation, diarrhea and nausea.  Endocrine: Negative for cold intolerance and heat intolerance.  Genitourinary:  Negative for difficulty urinating, dysuria, flank pain, frequency, hematuria and urgency.  Musculoskeletal:  Positive for arthralgias, back pain, joint swelling and myalgias. Negative for gait  problem and neck pain.  Skin:  Negative for color change and pallor.  Allergic/Immunologic: Negative for environmental allergies and food allergies.  Neurological:  Negative for dizziness, seizures, syncope, facial asymmetry, speech difficulty, light-headedness, numbness and headaches.  Psychiatric/Behavioral:  Negative for agitation, behavioral problems, confusion, decreased concentration, dysphoric mood, hallucinations, self-injury, sleep disturbance and suicidal ideas. The patient is not nervous/anxious and is not hyperactive.     General Appearance: Casual  Eye Contact:  Good  Speech:  Clear and Coherent and Normal Rate  Volume:  Normal  Mood:  Euthymic  Affect:  Appropriate  Thought Content: Logical   Suicidal Thoughts:  No  Homicidal Thoughts:  No  Thought Process:  Linear  Orientation:  Full (Time, Place, and Person)    Memory: Immediate;   Fair Recent;   Fair Remote;   Fair  Judgment:  Fair  Insight:  Fair  Concentration:  Concentration: Good and Attention Span: Good  Recall:  not formally assessed   Fund of Knowledge: Good  Language: Good  Psychomotor Activity:  Normal  Akathisia:  No  AIMS (if indicated): not done  Assets:  Communication Skills Desire for Improvement Financial Resources/Insurance Housing Intimacy Leisure Time  ADL's:  Intact  Cognition: WNL  Sleep:  Fair   Metabolic Disorder Labs: Lab Results  Component Value Date   HGBA1C 5.4 06/21/2017   MPG 108.28 06/21/2017   No results found for: PROLACTIN Lab Results   Component Value Date   CHOL 147 08/01/2022   TRIG 76 08/01/2022   HDL 63 08/01/2022   CHOLHDL 2.3 08/01/2022   VLDL 20 06/21/2017   LDLCALC 69 08/01/2022   LDLCALC 153 (H) 06/21/2017   Lab Results  Component Value Date   TSH 2.270 01/27/2023   TSH 0.918 11/16/2022    Therapeutic Level Labs: No results found for: LITHIUM No results found for: VALPROATE No results found for: CBMZ   Screenings: GAD-7    Flowsheet Row Office Visit from 07/26/2023 in [facility]  Total GAD-7 Score 19   PHQ2-9    Flowsheet Row Clinical Support from 09/07/2023 in [facility] Office Visit from 07/26/2023 in [facility] Office Visit from 03/27/2023 in [facility] Office Visit from 08/01/2022 in [facility] Office Visit from 07/28/2022 in [facility]  PHQ-2 Total Score 2 6 6 2  0  PHQ-9 Total Score 6 22 26 13  --   Flowsheet Row Office Visit from 07/26/2023 in [facility] from 09/17/2021 in [facility])  C-SSRS RISK CATEGORY Moderate Risk No Risk    Collaboration of Care: Collaboration of Care: Other Dr. Susen  Patient/Guardian was advised Release of Information must be obtained prior to any record release in order to collaborate their care with an outside provider. Patient/Guardian was advised if they have not already done so to contact the registration department to sign all necessary forms in order for us  to release information regarding their care.   Consent: Patient/Guardian gives verbal consent for treatment and assignment of benefits for services provided during this visit. Patient/Guardian expressed understanding and agreed to proceed.    Hiilani Jetter, MD 10/05/2023, 9:37 AM

## 2023-10-06 ENCOUNTER — Encounter (HOSPITAL_COMMUNITY)

## 2023-10-23 NOTE — Progress Notes (Addendum)
 BH MD/PA/NP OP Progress Note  11/02/2023 11:43 AM [name] [name]  MRN:  [id number]  Visit Diagnosis:    ICD-10-CM   1. MDD (major depressive disorder), recurrent, in partial remission  F33.41 escitalopram  (LEXAPRO ) 5 MG tablet    buPROPion  (WELLBUTRIN  XL) 150 MG 24 hr tablet    2. Generalized anxiety disorder  F41.1 busPIRone  (BUSPAR ) 30 MG tablet       Assessment:  [name] [name] is a 63 year old female with a past psychiatric history significant for major depressive disorder (severe episode, recurrent, without psychotic features) and generalized anxiety disorder who presents to [facility] for medication management and follow-up.  She has continued to improve from her previous appointments, has been eating and sleeping well.  She is not feeling depressed or anxious and her panic attacks have improved.  She is not actively passively with her suicidal.  She does have a lot of psychosocial stressors, being well for her, taking care of her husband, financial stressors that have been affecting her mood however she has denied worsening of her mood.  She has stopped using Xanax  and has also taken the decreased dose of Lexapro  well.  Since she is on bupropion , plan to decrease Lexapro  to 5 mg for 2 weeks and discontinue further.  Will also continue buspirone  for anxiety as that has helped.  She is not amenable for therapy currently, will continue to reinforce the subsequent visits.  We will have her back in the clinic in 8 to 10 weeks.  Risk Assessment: An assessment of suicide and violence risk factors was performed as part of this evaluation and is not  significantly changed from the last visit. While future psychiatric events cannot be accurately predicted, the patient does not  currently require acute inpatient psychiatric care and does not  currently meet [place]  involuntary commitment criteria. Patient was given contact information for crisis resources,  behavioral health clinic and was instructed to call 911 for emergencies.   1. MDD currently in remission --Continue bupropion  150 mg XL daily --Decrease Lexapro  to 5 mg daily for 2 weeks and then discontinue  2. Generalized anxiety disorder --Decrease Lexapro  to 5 mg daily for 2 weeks and then discontinue --Continue buspirone  30 mg twice daily --Continue Xanax  0.5 mg as needed, currently being managed by primary care provider  3.  History of alcohol use disorder in remission -- Continue abstinence  Chief Complaint:  Chief Complaint  Patient presents with   Follow-up   Medication Refill   Depression   Anxiety   HPI:  [name] [name] is a 63 year old female with a past psychiatric history significant for major depressive disorder (severe episode, recurrent, without psychotic features) and generalized anxiety disorder who presents to [facility]  for a follow-up appointment.  Today, she reported doing okay.  Patient continues to report her mood as up-and-down.  She denied any active or passive SI/HI/AVH.  She reported her sleep as okay and her appetite has same , stated that she eats 1 big meal a day and snacks in between.  She denied any nightmares or flashbacks.  When asked to panic attack she stated no, stated that they have improved and she has not had any.  Stressors that includes coming out of the house, attending appointments she stated that I have been able to do that.  When asked about depression she stated that some days are better .  And anxiety she reported it is due to everyday  stress .  She has continued to smoke a pack of cigarettes every day, denies any any new substances.  Reiterated therapy, patient stated that it is hard for me.  As she is taking care of her husband as well.  Reported that she has stopped using Xanax  and has been compliant with the rest of the medications that includes escitalopram , bupropion  and  buspirone .  She denies any side effects or any physical concerns.  We discussed both decreasing dose of Lexapro  to 5 mg for 14 days, she wanted to discontinue it.  Plan to continue rest of the same medications and have her back in the clinic in 8 to 10 weeks.  Past Psychiatric History:  Patient has a past psychiatric history significant for major depressive disorder and generalized anxiety disorder. Patient denies a past history of hospitalization due to mental health.  Patient endorses a past history of suicide attempt stating that she had an accidental overdose.  Patient reports that she participated in group therapy sessions following her attempt. Patient denies a past history of homicide attempts.  Past Medical History:  Past Medical History:  Diagnosis Date   Anxiety    on meds   Cellulitis of left lower extremity 06/21/2017   Hyperlipidemia 06/21/2017   on meds   Hypertension    on meds   Osteoarthritis     Past Surgical History:  Procedure Laterality Date   LAPAROSCOPIC CHOLECYSTECTOMY  2010   TUBAL LIGATION  1986    Family Psychiatric History: Mother - past history of nervous breakdown, hospitalized due to past suicide attempt Brother (deceased) - major depressive disorder, history of suicide attempt  Family History:  Family History  Problem Relation Age of Onset   Diabetes Mother    Liver disease Mother 36       NAFLD-   Colon polyps Father 5   Colon cancer Father 12   Colon polyps Brother 76   Esophageal cancer Neg Hx    Rectal cancer Neg Hx    Stomach cancer Neg Hx     Social History:  Social History   Socioeconomic History   Marital status: Married    Spouse name: Not on file   Number of children: Not on file   Years of education: Not on file   Highest education level: Not on file  Occupational History   Not on file  Tobacco Use   Smoking status: Every Day    Current packs/day: 1.00    Average packs/day: 1 pack/day for 40.0 years (40.0 ttl pk-yrs)     Types: Cigarettes   Smokeless tobacco: Never  Vaping Use   Vaping status: Never Used  Substance and Sexual Activity   Alcohol use: Not Currently   Drug use: Never   Sexual activity: Not Currently  Other Topics Concern   Not on file  Social History Narrative   Not on file   Social Drivers of Health   Financial Resource Strain: Not on file  Food Insecurity: Not on file  Transportation Needs: Not on file  Physical Activity: Not on file  Stress: Not on file  Social Connections: Not on file    Allergies: No Known Allergies  Current Medications: Current Outpatient Medications  Medication Sig Dispense Refill   ALPRAZolam  (XANAX ) 0.5 MG tablet Take 1 tablet (0.5 mg total) by mouth daily as needed for anxiety. 30 tablet 2   Ascorbic Acid (VITAMIN C PO) Take 1 tablet by mouth daily at 6 (six) AM.  atenolol  (TENORMIN ) 25 MG tablet Take 1 tablet (25 mg total) by mouth daily. 90 tablet 1   atorvastatin  (LIPITOR) 80 MG tablet Take 1 tablet (80 mg total) by mouth daily. 90 tablet 1   buPROPion  (WELLBUTRIN  XL) 150 MG 24 hr tablet Take 1 tablet (150 mg total) by mouth daily. 90 tablet 1   busPIRone  (BUSPAR ) 30 MG tablet Take 1 tablet (30 mg total) by mouth 2 (two) times daily. 180 tablet 1   celecoxib  (CELEBREX ) 200 MG capsule Take 1 capsule (200 mg total) by mouth daily. 180 capsule 3   escitalopram  (LEXAPRO ) 5 MG tablet Take 1 tablet (5 mg total) by mouth daily. 14 tablet 0   furosemide  (LASIX ) 20 MG tablet Take 1 tablet (20 mg total) by mouth daily. 90 tablet 1   HYDROcodone -acetaminophen  (NORCO/VICODIN) 5-325 MG tablet Take 0.5 tablets by mouth every 12 (twelve) hours as needed for moderate pain (pain score 4-6). 30 tablet 0   levothyroxine  (SYNTHROID ) 25 MCG tablet TAKE 1 TABLET BY MOUTH DAILY BEFORE BREAKFAST. 90 tablet 0   Multiple Vitamins-Minerals (ZINC PO) Take 1 tablet by mouth daily at 6 (six) AM.     potassium chloride  (KLOR-CON ) 10 MEQ tablet TAKE 1 TABLET BY MOUTH EVERY  DAY 90 tablet 1   rOPINIRole  (REQUIP ) 0.25 MG tablet Take 1 tablet (0.25 mg total) by mouth at bedtime. 180 tablet 1   Vibegron  (GEMTESA ) 75 MG TABS Take 1 tablet (75 mg total) by mouth daily.     VITAMIN D PO Take 1 tablet by mouth daily at 6 (six) AM.     No current facility-administered medications for this visit.     Musculoskeletal: Strength & Muscle Tone: within normal limits Gait & Station: normal Patient leans: N/A  Psychiatric Specialty Exam: Blood pressure (!) 163/88, pulse (!) 109, height 5' 9 (1.753 m), weight 212 lb (96.2 kg).Body mass index is 31.31 kg/m. Review of Systems  Constitutional:  Negative for activity change, appetite change, chills, diaphoresis and fatigue.  HENT:  Negative for congestion, dental problem, drooling, ear discharge and ear pain.   Eyes:  Negative for pain, discharge and itching.  Respiratory:  Negative for apnea, cough, choking and chest tightness.   Cardiovascular:  Negative for chest pain, palpitations and leg swelling.  Gastrointestinal:  Negative for abdominal distention, abdominal pain, constipation, diarrhea and nausea.  Endocrine: Negative for cold intolerance and heat intolerance.  Genitourinary:  Negative for difficulty urinating, dysuria, flank pain, frequency, hematuria and urgency.  Musculoskeletal:  Positive for arthralgias, back pain, joint swelling and myalgias. Negative for gait problem and neck pain.  Skin:  Negative for color change and pallor.  Allergic/Immunologic: Negative for environmental allergies and food allergies.  Neurological:  Negative for dizziness, seizures, syncope, facial asymmetry, speech difficulty, light-headedness, numbness and headaches.  Psychiatric/Behavioral:  Negative for agitation, behavioral problems, confusion, decreased concentration, dysphoric mood, hallucinations, self-injury, sleep disturbance and suicidal ideas. The patient is not nervous/anxious and is not hyperactive.     General Appearance:  Casual  Eye Contact:  Good  Speech:  Clear and Coherent and Normal Rate  Volume:  Normal  Mood:  Euthymic  Affect:  Appropriate  Thought Content: Logical   Suicidal Thoughts:  No  Homicidal Thoughts:  No  Thought Process:  Linear  Orientation:  Full (Time, Place, and Person)    Memory: Immediate;   Fair Recent;   Fair Remote;   Fair  Judgment:  Fair  Insight:  Fair  Concentration:  Concentration: Good  and Attention Span: Good  Recall:  not formally assessed   Fund of Knowledge: Good  Language: Good  Psychomotor Activity:  Normal  Akathisia:  No  AIMS (if indicated): not done  Assets:  Communication Skills Desire for Improvement Financial Resources/Insurance Housing Intimacy Leisure Time  ADL's:  Intact  Cognition: WNL  Sleep:  Fair   Metabolic Disorder Labs: Lab Results  Component Value Date   HGBA1C 5.4 06/21/2017   MPG 108.28 06/21/2017   No results found for: PROLACTIN Lab Results  Component Value Date   CHOL 147 08/01/2022   TRIG 76 08/01/2022   HDL 63 08/01/2022   CHOLHDL 2.3 08/01/2022   VLDL 20 06/21/2017   LDLCALC 69 08/01/2022   LDLCALC 153 (H) 06/21/2017   Lab Results  Component Value Date   TSH 2.270 01/27/2023   TSH 0.918 11/16/2022    Therapeutic Level Labs: No results found for: LITHIUM No results found for: VALPROATE No results found for: CBMZ   Screenings: GAD-7    Flowsheet Row Office Visit from 07/26/2023 in [facility]  Total GAD-7 Score 19   PHQ2-9    Flowsheet Row Clinical Support from 11/02/2023 in [facility] Clinical Support from 09/07/2023 in [facility] Office Visit from 07/26/2023 in [facility] Office Visit from 03/27/2023 in [facility] Office Visit from 08/01/2022 in [facility]  PHQ-2 Total Score 1 2 6 6 2   PHQ-9 Total Score -- 6 22 26 13    Flowsheet Row Office Visit from  07/26/2023 in [facility] from 09/17/2021 in [facility])  C-SSRS RISK CATEGORY Moderate Risk No Risk    Collaboration of Care: Collaboration of Care: Other Dr. Susen  Patient/Guardian was advised Release of Information must be obtained prior to any record release in order to collaborate their care with an outside provider. Patient/Guardian was advised if they have not already done so to contact the registration department to sign all necessary forms in order for us  to release information regarding their care.   Consent: Patient/Guardian gives verbal consent for treatment and assignment of benefits for services provided during this visit. Patient/Guardian expressed understanding and agreed to proceed.    Aditri Louischarles, MD 11/02/2023, 11:43 AM

## 2023-10-31 ENCOUNTER — Encounter (HOSPITAL_COMMUNITY)

## 2023-11-02 ENCOUNTER — Ambulatory Visit (HOSPITAL_COMMUNITY)

## 2023-11-02 VITALS — BP 163/88 | HR 109 | Ht 69.0 in | Wt 212.0 lb

## 2023-11-02 DIAGNOSIS — F3341 Major depressive disorder, recurrent, in partial remission: Secondary | ICD-10-CM | POA: Diagnosis not present

## 2023-11-02 DIAGNOSIS — F411 Generalized anxiety disorder: Secondary | ICD-10-CM

## 2023-11-02 MED ORDER — BUPROPION HCL ER (XL) 150 MG PO TB24: 150.0000 mg | ORAL_TABLET | Freq: Every day | ORAL | 1 refills | Status: AC

## 2023-11-02 MED ORDER — ESCITALOPRAM OXALATE 5 MG PO TABS: 5.0000 mg | ORAL_TABLET | Freq: Every day | ORAL | 0 refills | Status: AC

## 2023-11-02 MED ORDER — BUSPIRONE HCL 30 MG PO TABS: 30.0000 mg | ORAL_TABLET | Freq: Two times a day (BID) | ORAL | 1 refills | Status: AC

## 2023-11-11 ENCOUNTER — Other Ambulatory Visit: Payer: Self-pay | Admitting: Internal Medicine

## 2023-11-24 ENCOUNTER — Encounter: Admitting: Internal Medicine

## 2023-12-21 ENCOUNTER — Encounter: Admitting: Internal Medicine

## 2023-12-21 NOTE — Progress Notes (Deleted)
 BH MD/PA/NP OP Progress Note  12/21/2023 2:20 PM [name] [name]  MRN:  [id number]  Visit Diagnosis:  No diagnosis found.    Assessment:  [name] [name] is a 63 year old female with a past psychiatric history significant for major depressive disorder (severe episode, recurrent, without psychotic features) and generalized anxiety disorder who presents to [facility] for medication management and follow-up.  She has continued to improve from her previous appointments, has been eating and sleeping well.  She is not feeling depressed or anxious and her panic attacks have improved.  She is not actively passively with her suicidal.  She does have a lot of psychosocial stressors, being well for her, taking care of her husband, financial stressors that have been affecting her mood however she has denied worsening of her mood.  She has stopped using Xanax  and has also taken the decreased dose of Lexapro  well.  Since she is on bupropion , plan to decrease Lexapro  to 5 mg for 2 weeks and discontinue further.  Will also continue buspirone  for anxiety as that has helped.  She is not amenable for therapy currently, will continue to reinforce the subsequent visits.  We will have her back in the clinic in 8 to 10 weeks.  Risk Assessment: An assessment of suicide and violence risk factors was performed as part of this evaluation and is not  significantly changed from the last visit. While future psychiatric events cannot be accurately predicted, the patient does not  currently require acute inpatient psychiatric care and does not  currently meet [facility]  involuntary commitment criteria. Patient was given contact information for crisis resources, behavioral health clinic and was instructed to call 911 for emergencies.   1. MDD currently in remission --Continue bupropion  150 mg XL daily --Decrease Lexapro  to 5 mg daily for 2 weeks and then discontinue  2. Generalized anxiety  disorder --Decrease Lexapro  to 5 mg daily for 2 weeks and then discontinue --Continue buspirone  30 mg twice daily --Continue Xanax  0.5 mg as needed, currently being managed by primary care provider  3.  History of alcohol use disorder in remission -- Continue abstinence  Chief Complaint:  No chief complaint on file.  HPI:  [name] [name] is a 63 year old female with a past psychiatric history significant for major depressive disorder (severe episode, recurrent, without psychotic features) and generalized anxiety disorder who presents to [facility]  for a follow-up appointment.  Today, she reported doing okay.  Patient continues to report her mood as up-and-down.  She denied any active or passive SI/HI/AVH.  She reported her sleep as okay and her appetite has same , stated that she eats 1 big meal a day and snacks in between.  She denied any nightmares or flashbacks.  When asked to panic attack she stated no, stated that they have improved and she has not had any.  Stressors that includes coming out of the house, attending appointments she stated that I have been able to do that.  When asked about depression she stated that some days are better .  And anxiety she reported it is due to everyday stress .  She has continued to smoke a pack of cigarettes every day, denies any any new substances.  Reiterated therapy, patient stated that it is hard for me.  As she is taking care of her husband as well.  Reported that she has stopped using Xanax  and has been compliant with the rest of the medications that includes  escitalopram , bupropion  and buspirone .  She denies any side effects or any physical concerns.  We discussed both decreasing dose of Lexapro  to 5 mg for 14 days, she wanted to discontinue it.  Plan to continue rest of the same medications and have her back in the clinic in 8 to 10 weeks.  Past Psychiatric History:  Patient has a past  psychiatric history significant for major depressive disorder and generalized anxiety disorder. Patient denies a past history of hospitalization due to mental health.  Patient endorses a past history of suicide attempt stating that she had an accidental overdose.  Patient reports that she participated in group therapy sessions following her attempt. Patient denies a past history of homicide attempts.  Past Medical History:  Past Medical History:  Diagnosis Date   Anxiety    on meds   Cellulitis of left lower extremity 06/21/2017   Hyperlipidemia 06/21/2017   on meds   Hypertension    on meds   Osteoarthritis     Past Surgical History:  Procedure Laterality Date   LAPAROSCOPIC CHOLECYSTECTOMY  2010   TUBAL LIGATION  1986    Family Psychiatric History: Mother - past history of nervous breakdown, hospitalized due to past suicide attempt Brother (deceased) - major depressive disorder, history of suicide attempt  Family History:  Family History  Problem Relation Age of Onset   Diabetes Mother    Liver disease Mother 27       NAFLD-   Colon polyps Father 78   Colon cancer Father 72   Colon polyps Brother 22   Esophageal cancer Neg Hx    Rectal cancer Neg Hx    Stomach cancer Neg Hx     Social History:  Social History   Socioeconomic History   Marital status: Married    Spouse name: Not on file   Number of children: Not on file   Years of education: Not on file   Highest education level: Not on file  Occupational History   Not on file  Tobacco Use   Smoking status: Every Day    Current packs/day: 1.00    Average packs/day: 1 pack/day for 40.0 years (40.0 ttl pk-yrs)    Types: Cigarettes   Smokeless tobacco: Never  Vaping Use   Vaping status: Never Used  Substance and Sexual Activity   Alcohol use: Not Currently   Drug use: Never   Sexual activity: Not Currently  Other Topics Concern   Not on file  Social History Narrative   Not on file   Social Drivers of  Health   Financial Resource Strain: Not on file  Food Insecurity: Not on file  Transportation Needs: Not on file  Physical Activity: Not on file  Stress: Not on file  Social Connections: Not on file    Allergies: No Known Allergies  Current Medications: Current Outpatient Medications  Medication Sig Dispense Refill   ALPRAZolam  (XANAX ) 0.5 MG tablet Take 1 tablet (0.5 mg total) by mouth daily as needed for anxiety. 30 tablet 2   Ascorbic Acid (VITAMIN C PO) Take 1 tablet by mouth daily at 6 (six) AM.     atenolol  (TENORMIN ) 25 MG tablet TAKE 1 TABLET (25 MG TOTAL) BY MOUTH DAILY. 90 tablet 1   atorvastatin  (LIPITOR) 80 MG tablet TAKE 1 TABLET BY MOUTH EVERY DAY 90 tablet 1   buPROPion  (WELLBUTRIN  XL) 150 MG 24 hr tablet Take 1 tablet (150 mg total) by mouth daily. 90 tablet 1   busPIRone  (BUSPAR )  30 MG tablet Take 1 tablet (30 mg total) by mouth 2 (two) times daily. 180 tablet 1   celecoxib  (CELEBREX ) 200 MG capsule Take 1 capsule (200 mg total) by mouth daily. 180 capsule 3   escitalopram  (LEXAPRO ) 5 MG tablet Take 1 tablet (5 mg total) by mouth daily. 14 tablet 0   furosemide  (LASIX ) 20 MG tablet Take 1 tablet (20 mg total) by mouth daily. 90 tablet 1   HYDROcodone -acetaminophen  (NORCO/VICODIN) 5-325 MG tablet Take 0.5 tablets by mouth every 12 (twelve) hours as needed for moderate pain (pain score 4-6). 30 tablet 0   levothyroxine  (SYNTHROID ) 25 MCG tablet TAKE 1 TABLET BY MOUTH DAILY BEFORE BREAKFAST. 90 tablet 0   Multiple Vitamins-Minerals (ZINC PO) Take 1 tablet by mouth daily at 6 (six) AM.     potassium chloride  (KLOR-CON ) 10 MEQ tablet TAKE 1 TABLET BY MOUTH EVERY DAY 90 tablet 1   rOPINIRole  (REQUIP ) 0.25 MG tablet Take 1 tablet (0.25 mg total) by mouth at bedtime. 180 tablet 1   Vibegron  (GEMTESA ) 75 MG TABS Take 1 tablet (75 mg total) by mouth daily.     VITAMIN D PO Take 1 tablet by mouth daily at 6 (six) AM.     No current facility-administered medications for this  visit.     Musculoskeletal: Strength & Muscle Tone: within normal limits Gait & Station: normal Patient leans: N/A  Psychiatric Specialty Exam: There were no vitals taken for this visit.There is no height or weight on file to calculate BMI. Review of Systems  Constitutional:  Negative for activity change, appetite change, chills, diaphoresis and fatigue.  HENT:  Negative for congestion, dental problem, drooling, ear discharge and ear pain.   Eyes:  Negative for pain, discharge and itching.  Respiratory:  Negative for apnea, cough, choking and chest tightness.   Cardiovascular:  Negative for chest pain, palpitations and leg swelling.  Gastrointestinal:  Negative for abdominal distention, abdominal pain, constipation, diarrhea and nausea.  Endocrine: Negative for cold intolerance and heat intolerance.  Genitourinary:  Negative for difficulty urinating, dysuria, flank pain, frequency, hematuria and urgency.  Musculoskeletal:  Positive for arthralgias, back pain, joint swelling and myalgias. Negative for gait problem and neck pain.  Skin:  Negative for color change and pallor.  Allergic/Immunologic: Negative for environmental allergies and food allergies.  Neurological:  Negative for dizziness, seizures, syncope, facial asymmetry, speech difficulty, light-headedness, numbness and headaches.  Psychiatric/Behavioral:  Negative for agitation, behavioral problems, confusion, decreased concentration, dysphoric mood, hallucinations, self-injury, sleep disturbance and suicidal ideas. The patient is not nervous/anxious and is not hyperactive.     General Appearance: Casual  Eye Contact:  Good  Speech:  Clear and Coherent and Normal Rate  Volume:  Normal  Mood:  Euthymic  Affect:  Appropriate  Thought Content: Logical   Suicidal Thoughts:  No  Homicidal Thoughts:  No  Thought Process:  Linear  Orientation:  Full (Time, Place, and Person)    Memory: Immediate;   Fair Recent;   Fair Remote;    Fair  Judgment:  Fair  Insight:  Fair  Concentration:  Concentration: Good and Attention Span: Good  Recall:  not formally assessed   Fund of Knowledge: Good  Language: Good  Psychomotor Activity:  Normal  Akathisia:  No  AIMS (if indicated): not done  Assets:  Communication Skills Desire for Improvement Financial Resources/Insurance Housing Intimacy Leisure Time  ADL's:  Intact  Cognition: WNL  Sleep:  Fair   Metabolic Disorder Labs: Lab Results  Component Value Date   HGBA1C 5.4 06/21/2017   MPG 108.28 06/21/2017   No results found for: PROLACTIN Lab Results  Component Value Date   CHOL 147 08/01/2022   TRIG 76 08/01/2022   HDL 63 08/01/2022   CHOLHDL 2.3 08/01/2022   VLDL 20 06/21/2017   LDLCALC 69 08/01/2022   LDLCALC 153 (H) 06/21/2017   Lab Results  Component Value Date   TSH 2.270 01/27/2023   TSH 0.918 11/16/2022    Therapeutic Level Labs: No results found for: LITHIUM No results found for: VALPROATE No results found for: CBMZ   Screenings: GAD-7    Flowsheet Row Office Visit from 07/26/2023 in [facility]  Total GAD-7 Score 19   PHQ2-9    Flowsheet Row Clinical Support from 11/02/2023 in [facility] Clinical Support from 09/07/2023 in [facility] Office Visit from 07/26/2023 in [facility] Office Visit from 03/27/2023 in [facility] Office Visit from 08/01/2022 in [facility]  PHQ-2 Total Score 1 2 6 6 2   PHQ-9 Total Score -- 6 22 26 13    Flowsheet Row Office Visit from 07/26/2023 in [facility] from 09/17/2021 in [facility])  C-SSRS RISK CATEGORY Moderate Risk No Risk    Collaboration of Care: Collaboration of Care: Other Dr. Susen  Patient/Guardian was advised Release of Information must be obtained prior to any record release in  order to collaborate their care with an outside provider. Patient/Guardian was advised if they have not already done so to contact the registration department to sign all necessary forms in order for us  to release information regarding their care.   Consent: Patient/Guardian gives verbal consent for treatment and assignment of benefits for services provided during this visit. Patient/Guardian expressed understanding and agreed to proceed.    Elma Shands, MD 12/21/2023, 2:20 PM

## 2024-01-04 ENCOUNTER — Encounter (HOSPITAL_COMMUNITY)

## 2024-01-04 ENCOUNTER — Encounter (HOSPITAL_COMMUNITY): Payer: Self-pay

## 2024-01-13 ENCOUNTER — Other Ambulatory Visit: Payer: Self-pay | Admitting: Internal Medicine

## 2024-02-20 IMAGING — MR MR CERVICAL SPINE W/O CM
6 of 8 series · 30 of 48 positions shown · non-contrast
Comparison: None

CLINICAL DATA: Chronic neck pain for several years.

EXAM:
MRI CERVICAL SPINE WITHOUT CONTRAST
TECHNIQUE: Multiplanar, multisequence MR imaging of the cervical spine was
performed. No intravenous contrast was administered.

[Series 5: T2 · sagittal · 3.0mm · 0.55mm/px · 4 of 15 slices shown (1 of 3)]
[im 1/15]
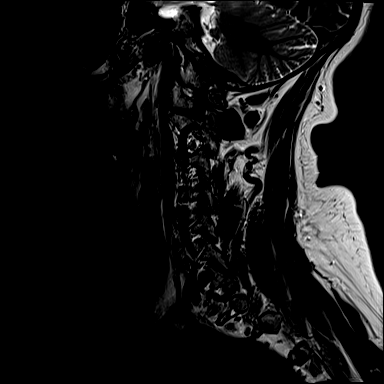
[im 5/15]
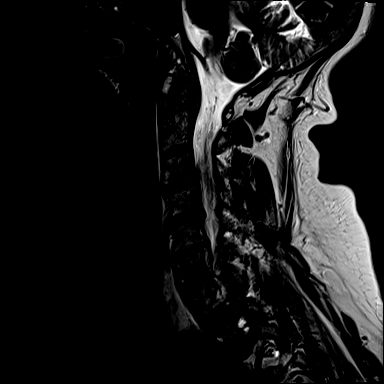
[im 10/15]
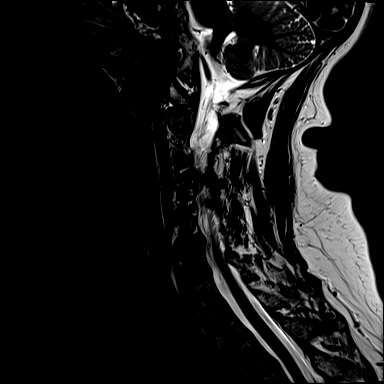
[im 15/15]
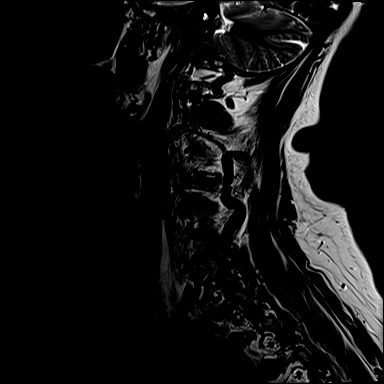

[Series 6: T1 · sagittal · 3.0mm · 0.66mm/px · 4 of 15 slices shown (1 of 2)]
[im 1/15]
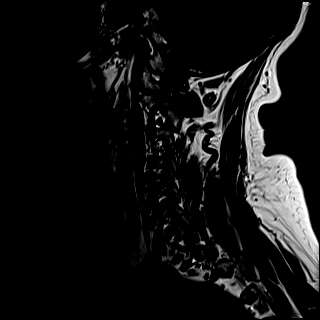
[im 5/15]
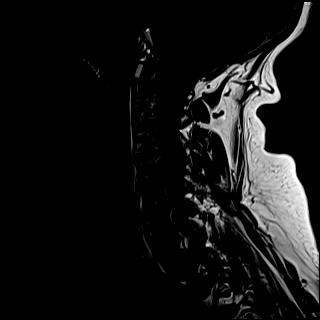
[im 10/15]
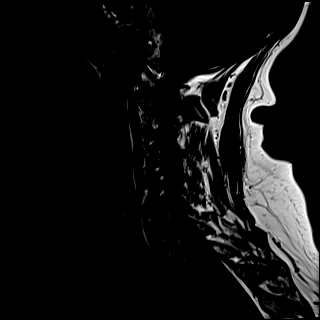
[im 15/15]
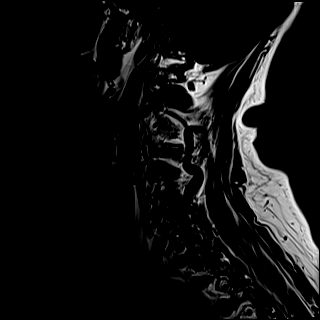

[Series 7: STIR · sagittal · 3.0mm · 0.33mm/px · 4 of 15 slices shown]
[im 1/15]
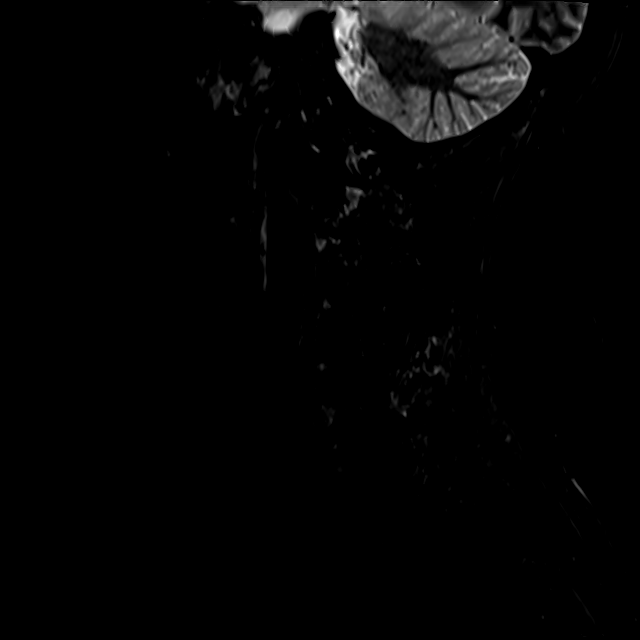
[im 4/15]
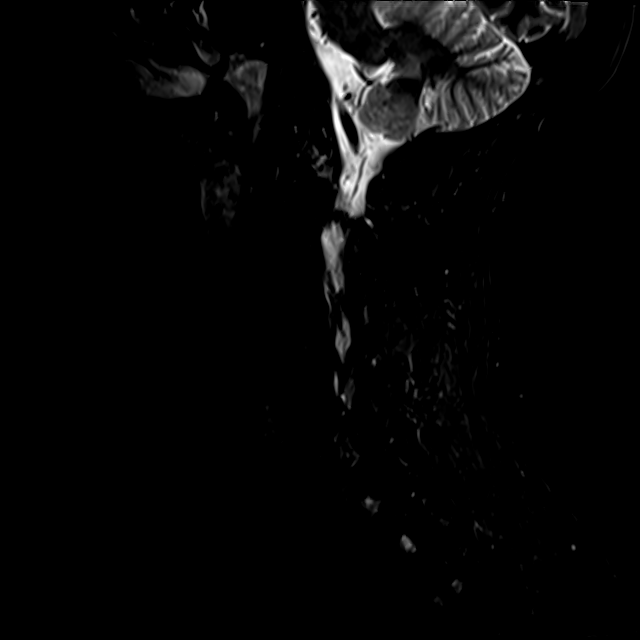
[im 8/15]
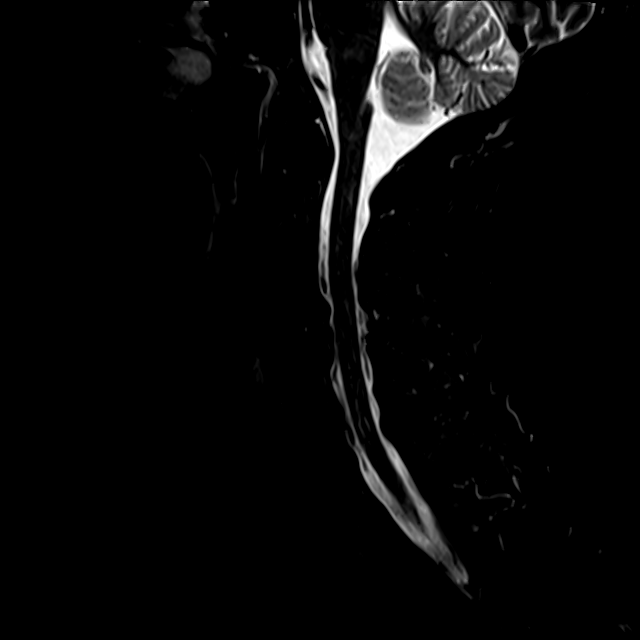
[im 11/15]
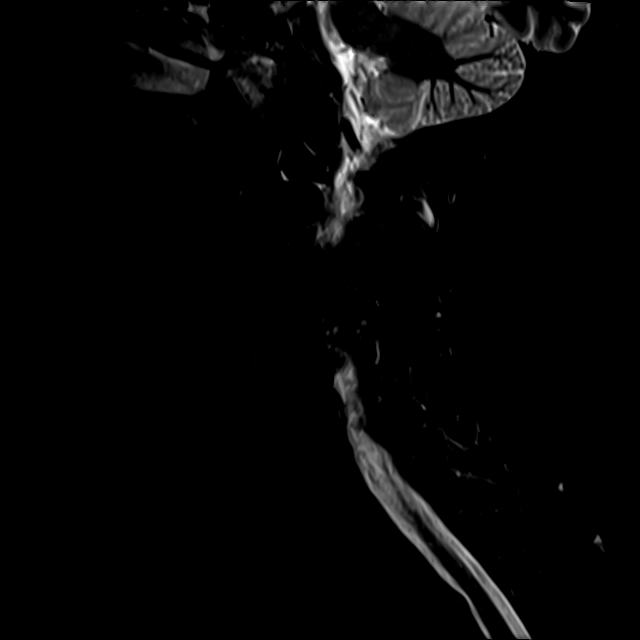

[Series 8: T2 · axial · 3.0mm · 0.50mm/px · z∈[-87,+20]mm · 8 of 34 slices shown (2 of 3)]
[im 1/34]
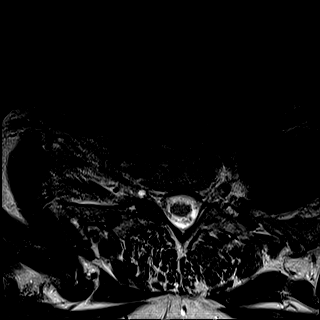
[im 4/34]
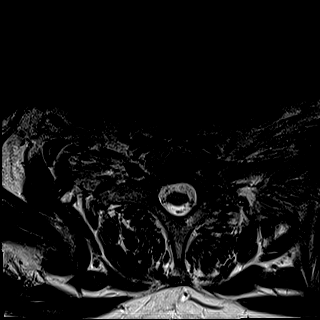
[im 12/34]
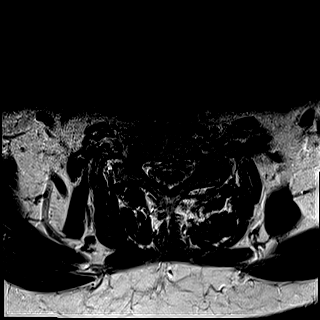
[im 15/34]
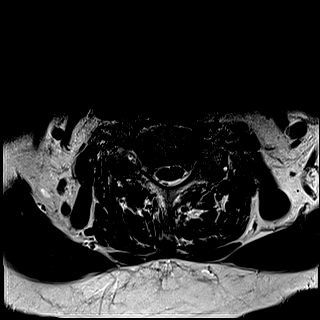
[im 19/34]
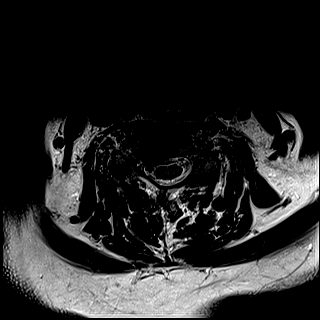
[im 23/34]
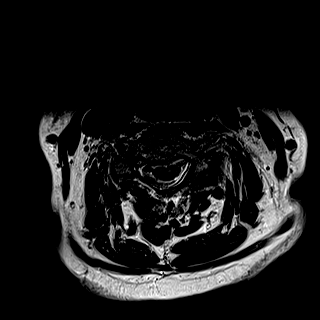
[im 30/34]
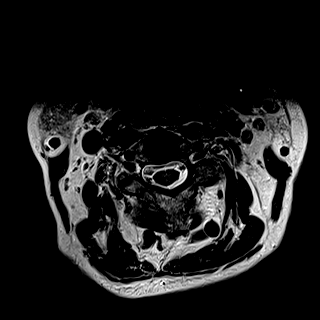
[im 34/34]
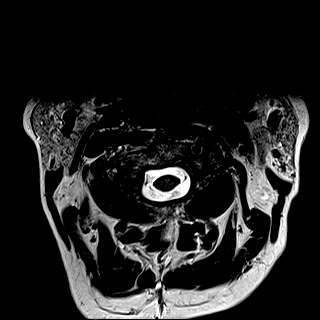

[Series 14: T2 · sagittal · 4.0mm · 0.73mm/px · 5 of 15 slices shown (3 of 3)]
[im 1/15]
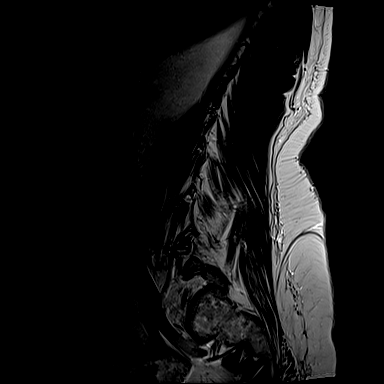
[im 4/15]
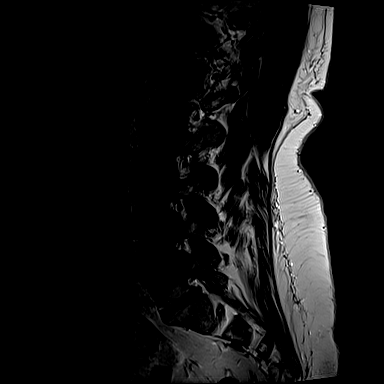
[im 8/15]
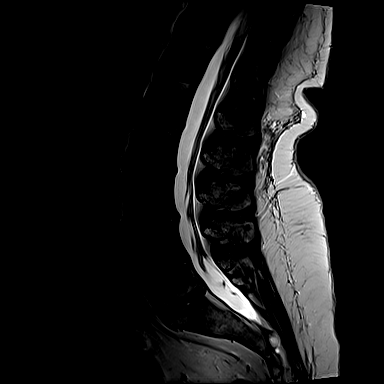
[im 11/15]
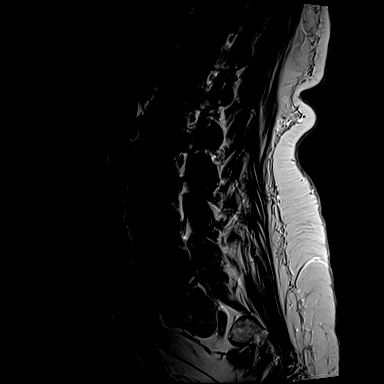
[im 15/15]
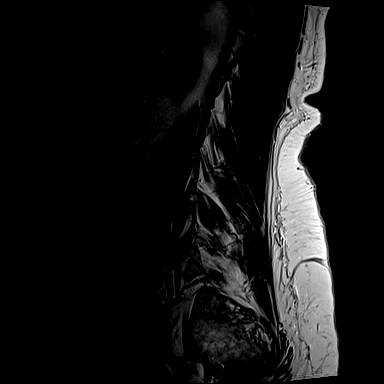

[Series 15: T1 · sagittal · 4.0mm · 0.73mm/px · 5 of 15 slices shown (2 of 2)]
[im 1/15]
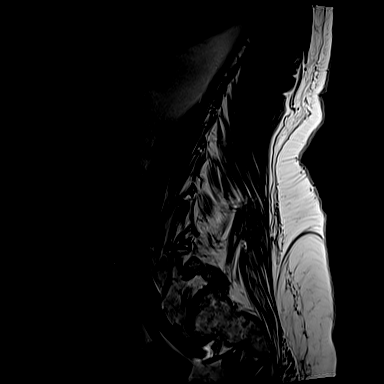
[im 4/15]
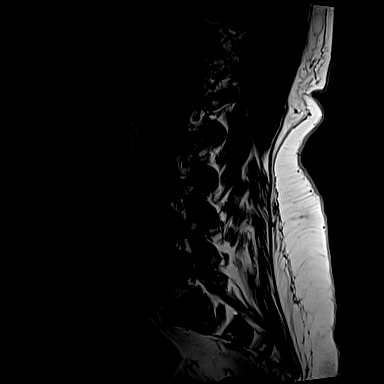
[im 8/15]
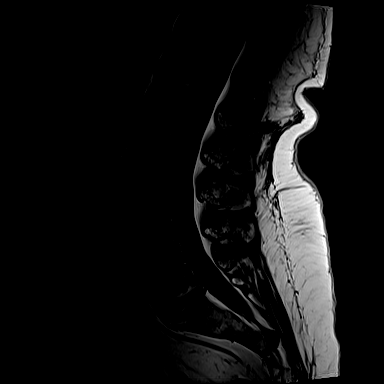
[im 11/15]
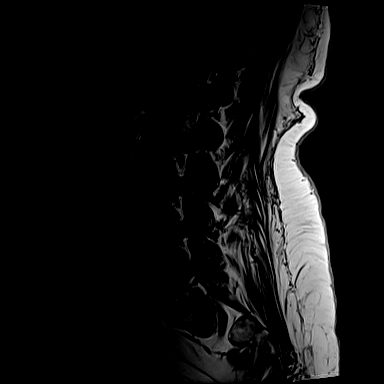
[im 15/15]
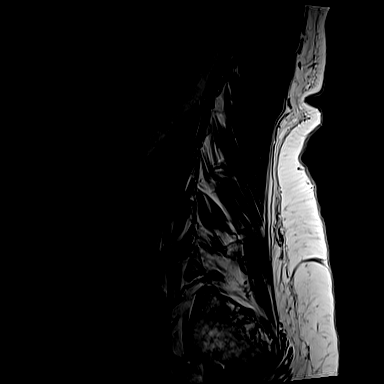

[30 of 48 positions shown; findings below may reference images not displayed]

FINDINGS: Alignment: 2 mm degenerative anterolisthesis C3-4. Otherwise normal
alignment.

Vertebrae: No fracture or focal bone lesion. Mild edema the facet on
the left at C4-5.

Cord: No cord compression or focal cord lesion.

Posterior Fossa, vertebral arteries, paraspinal tissues: Negative

Disc levels:

The foramen magnum is widely patent. There is ordinary mild
osteoarthritis of the C1-2 articulation but no encroachment upon the
neural structures.

C2-3: Apparent fusion across the facet joint on the left. Wide
patency of the canal and foramina.

C3-4: Facet arthropathy worse on the left with 2 mm of degenerative
anterolisthesis. Endplate osteophytes and bulging of the disc more
towards the left. No compressive central canal stenosis. Left
foraminal stenosis that could compress the C4 nerve.

C4-5: Endplate osteophytes and bulging of the disc. Facet
degeneration on the left. No compressive central canal stenosis.
Bilateral bony foraminal narrowing that could affect either C5
nerve. Some edematous change of the facet joint on the left could be
painful.

C5-6: Endplate osteophytes and bulging of the disc. No compressive
canal or foraminal narrowing.

C6-7: Minimal noncompressive disc bulge.

C7-T1: Normal interspace.
IMPRESSION: Fusion of the left facet joint at C2-3. No canal or foraminal
stenosis at that level.

C3-4: Facet arthropathy on the left with 2 mm of degenerative
anterolisthesis. Mild bulging of the disc. Left foraminal stenosis
that could compress the left C4 nerve.

C4-5: Spondylosis and facet osteoarthritis on the left. Bilateral
foraminal narrowing could affect either C5 nerve. Mild edematous
change of the facet joint on the left could indicate painful
arthritis.
